# Patient Record
Sex: Male | Born: 1960 | Race: White | Hispanic: No | Marital: Single | Smoking: Never smoker
Health system: Southern US, Community
[De-identification: ages and names within clinical notes are randomized; demographics above are authoritative.]

## PROBLEM LIST (undated history)

## (undated) DIAGNOSIS — F0390 Unspecified dementia without behavioral disturbance: Secondary | ICD-10-CM

## (undated) DIAGNOSIS — I10 Essential (primary) hypertension: Secondary | ICD-10-CM

## (undated) DIAGNOSIS — N179 Acute kidney failure, unspecified: Secondary | ICD-10-CM

## (undated) DIAGNOSIS — E079 Disorder of thyroid, unspecified: Secondary | ICD-10-CM

## (undated) HISTORY — PX: NO PAST SURGERIES: SHX2092

---

## 1998-04-27 ENCOUNTER — Emergency Department (HOSPITAL_COMMUNITY): Admission: EM | Admit: 1998-04-27 | Discharge: 1998-04-27 | Payer: Self-pay | Admitting: Emergency Medicine

## 1998-06-24 ENCOUNTER — Inpatient Hospital Stay (HOSPITAL_COMMUNITY): Admission: EM | Admit: 1998-06-24 | Discharge: 1998-06-28 | Payer: Self-pay | Admitting: Emergency Medicine

## 1998-07-01 ENCOUNTER — Encounter: Admission: RE | Admit: 1998-07-01 | Discharge: 1998-07-01 | Payer: Self-pay | Admitting: Family Medicine

## 1998-11-29 ENCOUNTER — Encounter: Payer: Self-pay | Admitting: Emergency Medicine

## 1998-11-29 ENCOUNTER — Emergency Department (HOSPITAL_COMMUNITY): Admission: EM | Admit: 1998-11-29 | Discharge: 1998-11-29 | Payer: Self-pay | Admitting: Emergency Medicine

## 1999-01-01 ENCOUNTER — Emergency Department (HOSPITAL_COMMUNITY): Admission: EM | Admit: 1999-01-01 | Discharge: 1999-01-01 | Payer: Self-pay | Admitting: Emergency Medicine

## 1999-01-02 ENCOUNTER — Encounter: Payer: Self-pay | Admitting: Emergency Medicine

## 1999-01-02 ENCOUNTER — Inpatient Hospital Stay (HOSPITAL_COMMUNITY): Admission: EM | Admit: 1999-01-02 | Discharge: 1999-01-06 | Payer: Self-pay | Admitting: Emergency Medicine

## 1999-01-14 ENCOUNTER — Emergency Department (HOSPITAL_COMMUNITY): Admission: EM | Admit: 1999-01-14 | Discharge: 1999-01-14 | Payer: Self-pay | Admitting: Emergency Medicine

## 1999-01-22 ENCOUNTER — Encounter: Payer: Self-pay | Admitting: Emergency Medicine

## 1999-01-22 ENCOUNTER — Emergency Department (HOSPITAL_COMMUNITY): Admission: EM | Admit: 1999-01-22 | Discharge: 1999-01-22 | Payer: Self-pay | Admitting: Emergency Medicine

## 1999-02-20 ENCOUNTER — Emergency Department (HOSPITAL_COMMUNITY): Admission: EM | Admit: 1999-02-20 | Discharge: 1999-02-20 | Payer: Self-pay | Admitting: Emergency Medicine

## 1999-02-20 ENCOUNTER — Encounter: Payer: Self-pay | Admitting: Emergency Medicine

## 2005-02-22 ENCOUNTER — Emergency Department (HOSPITAL_COMMUNITY): Admission: EM | Admit: 2005-02-22 | Discharge: 2005-02-22 | Payer: Self-pay | Admitting: Emergency Medicine

## 2005-03-24 ENCOUNTER — Inpatient Hospital Stay (HOSPITAL_COMMUNITY): Admission: RE | Admit: 2005-03-24 | Discharge: 2005-03-28 | Payer: Self-pay | Admitting: Emergency Medicine

## 2005-03-25 ENCOUNTER — Encounter: Payer: Self-pay | Admitting: Internal Medicine

## 2005-03-29 ENCOUNTER — Emergency Department (HOSPITAL_COMMUNITY): Admission: EM | Admit: 2005-03-29 | Discharge: 2005-03-29 | Payer: Self-pay | Admitting: Emergency Medicine

## 2005-05-01 ENCOUNTER — Emergency Department (HOSPITAL_COMMUNITY): Admission: EM | Admit: 2005-05-01 | Discharge: 2005-05-01 | Payer: Self-pay | Admitting: Emergency Medicine

## 2005-11-21 ENCOUNTER — Emergency Department (HOSPITAL_COMMUNITY): Admission: EM | Admit: 2005-11-21 | Discharge: 2005-11-21 | Payer: Self-pay | Admitting: Emergency Medicine

## 2006-02-21 ENCOUNTER — Encounter: Payer: Self-pay | Admitting: Family Medicine

## 2006-09-24 ENCOUNTER — Emergency Department (HOSPITAL_COMMUNITY): Admission: EM | Admit: 2006-09-24 | Discharge: 2006-09-24 | Payer: Self-pay | Admitting: Emergency Medicine

## 2007-06-11 ENCOUNTER — Emergency Department (HOSPITAL_COMMUNITY): Admission: EM | Admit: 2007-06-11 | Discharge: 2007-06-11 | Payer: Self-pay | Admitting: Emergency Medicine

## 2007-06-20 ENCOUNTER — Emergency Department (HOSPITAL_COMMUNITY): Admission: EM | Admit: 2007-06-20 | Discharge: 2007-06-20 | Payer: Self-pay | Admitting: Emergency Medicine

## 2007-07-02 ENCOUNTER — Emergency Department (HOSPITAL_COMMUNITY): Admission: EM | Admit: 2007-07-02 | Discharge: 2007-07-03 | Payer: Self-pay | Admitting: Emergency Medicine

## 2008-02-21 ENCOUNTER — Emergency Department (HOSPITAL_COMMUNITY): Admission: EM | Admit: 2008-02-21 | Discharge: 2008-02-21 | Payer: Self-pay | Admitting: Emergency Medicine

## 2008-11-06 ENCOUNTER — Emergency Department (HOSPITAL_COMMUNITY): Admission: EM | Admit: 2008-11-06 | Discharge: 2008-11-06 | Payer: Self-pay | Admitting: Emergency Medicine

## 2009-04-03 ENCOUNTER — Emergency Department (HOSPITAL_COMMUNITY): Admission: EM | Admit: 2009-04-03 | Discharge: 2009-04-03 | Payer: Self-pay | Admitting: Emergency Medicine

## 2009-08-27 ENCOUNTER — Emergency Department (HOSPITAL_COMMUNITY): Admission: EM | Admit: 2009-08-27 | Discharge: 2009-08-28 | Payer: Self-pay | Admitting: Emergency Medicine

## 2009-11-10 ENCOUNTER — Ambulatory Visit (HOSPITAL_COMMUNITY): Admission: RE | Admit: 2009-11-10 | Discharge: 2009-11-10 | Payer: Self-pay | Admitting: Urology

## 2010-03-21 ENCOUNTER — Emergency Department (HOSPITAL_COMMUNITY): Admission: EM | Admit: 2010-03-21 | Discharge: 2010-03-21 | Payer: Self-pay | Admitting: Emergency Medicine

## 2010-07-22 ENCOUNTER — Emergency Department (HOSPITAL_COMMUNITY): Admission: EM | Admit: 2010-07-22 | Discharge: 2010-07-22 | Payer: Self-pay | Admitting: Emergency Medicine

## 2010-07-23 ENCOUNTER — Emergency Department (HOSPITAL_COMMUNITY): Admission: EM | Admit: 2010-07-23 | Discharge: 2010-07-23 | Payer: Self-pay | Admitting: Emergency Medicine

## 2010-09-16 ENCOUNTER — Emergency Department (HOSPITAL_COMMUNITY)
Admission: EM | Admit: 2010-09-16 | Discharge: 2010-09-16 | Payer: Self-pay | Source: Home / Self Care | Admitting: Emergency Medicine

## 2011-01-05 LAB — POCT I-STAT, CHEM 8
BUN: 21 mg/dL (ref 6–23)
Creatinine, Ser: 1 mg/dL (ref 0.4–1.5)
Hemoglobin: 14.6 g/dL (ref 13.0–17.0)
Potassium: 4 mEq/L (ref 3.5–5.1)
Sodium: 138 mEq/L (ref 135–145)
TCO2: 28 mmol/L (ref 0–100)

## 2011-01-25 LAB — URINALYSIS, ROUTINE W REFLEX MICROSCOPIC
Nitrite: NEGATIVE
Protein, ur: NEGATIVE mg/dL
Specific Gravity, Urine: 1.023 (ref 1.005–1.030)
Urobilinogen, UA: 1 mg/dL (ref 0.0–1.0)

## 2011-01-25 LAB — URINE MICROSCOPIC-ADD ON

## 2011-01-25 LAB — POCT I-STAT, CHEM 8
BUN: 16 mg/dL (ref 6–23)
Calcium, Ion: 1.12 mmol/L (ref 1.12–1.32)
Creatinine, Ser: 1 mg/dL (ref 0.4–1.5)
Glucose, Bld: 87 mg/dL (ref 70–99)
Hemoglobin: 15 g/dL (ref 13.0–17.0)
TCO2: 25 mmol/L (ref 0–100)

## 2011-01-30 LAB — BASIC METABOLIC PANEL
BUN: 13 mg/dL (ref 6–23)
CO2: 28 mEq/L (ref 19–32)
Calcium: 9.1 mg/dL (ref 8.4–10.5)
Creatinine, Ser: 1.04 mg/dL (ref 0.4–1.5)
GFR calc non Af Amer: >60 mL/min (ref >60)
Glucose, Bld: 102 mg/dL — ABNORMAL HIGH (ref 70–99)
Sodium: 141 mEq/L (ref 135–145)

## 2011-01-30 LAB — CBC
Hemoglobin: 15.7 g/dL (ref 13.0–17.0)
MCHC: 34.4 g/dL (ref 30.0–36.0)
Platelets: 221 10*3/uL (ref 150–400)
RDW: 12.7 % (ref 11.5–15.5)

## 2011-01-30 LAB — DIFFERENTIAL
Basophils Absolute: 0.2 10*3/uL — ABNORMAL HIGH (ref 0.0–0.1)
Basophils Relative: 1 % (ref 0–1)
Lymphocytes Relative: 6 % — ABNORMAL LOW (ref 12–46)
Monocytes Absolute: 0.4 10*3/uL (ref 0.1–1.0)
Neutro Abs: 12.3 10*3/uL — ABNORMAL HIGH (ref 1.7–7.7)

## 2011-03-10 NOTE — H&P (Signed)
Donald Montoya, POSTIGLIONE NO.:  [id number]   MEDICAL RECORD NO.:  [id number]          PATIENT TYPE:  INP   LOCATION:  0104                         FACILITY:  [facility]   PHYSICIAN:  Thora Lance, M.D.  DATE OF BIRTH:  1960-12-08   DATE OF ADMISSION:  03/24/2005  DATE OF DISCHARGE:                                HISTORY & PHYSICAL   CHIEF COMPLAINT:  Vomiting.   HISTORY OF PRESENT ILLNESS:  This is a 50 year old white male resident of  [facility] with a history of hypertension and multi-  infarct dementia who presents with one day of multiple episodes of vomiting.  The patient is a very poor historian, and most of the history is obtained  from the nurse at the facility.  The patient may have had several black-out  spells.  He was not able to steer his wheelchair through the hall.  The RN  at the facility denied any hematemesis, melena, or bright red blood per  rectum.  He is on no new medications.  In the ER, he is found to have  significant azotemia and hypernatremia.   PAST MEDICAL HISTORY:  1.  Multi-infarct dementia.  2.  Hypertension.   PAST SURGICAL HISTORY:  Unknown.   ALLERGIES:  No known drug allergies.   CURRENT MEDICATIONS:  1.  Seroquel 50 mg in the a.m. and 100 mg in the p.m.  2.  Metoprolol 25 mg daily.  3.  Zocor 20 mg daily.  4.  Amantidine 200 mg b.i.d.  5.  Aggrenox 25/200 1 capsule twice daily.  6.  Depakote 250 mg 3 tablets q.h.s.  7.  Promethazine 25 mg q.4h. p.r.n.  8.  Triamcinolone 0.5% cream to legs p.r.n.   FAMILY HISTORY:  Unknown   SOCIAL HISTORY:  Unknown.   PHYSICAL EXAMINATION:  VITAL SIGNS:  Temperature 98.8, blood pressure  132/72, heart rate 90, respirations 20.  GENERAL:  He is alert, nonverbal, but does follow commands.  HEENT:  Anicteric.  Pupils are equal and reactive to light.  Extraocular  movements are intact.  Oropharynx shows very dry mucous membranes with some  dried blood in his mouth.  NECK:  Supple.  No lymphadenopathy.  LUNGS:  Clear.  HEART:  Regular rate and rhythm without murmur, rub or gallop.  ABDOMEN:  Soft and nontender.  No mass or hepatosplenomegaly.  RECTAL:  Declined by patient.  EXTREMITIES:  No edema.   LABORATORY:  CBC:  WBC 14.9 with a leftward shift.  Hemoglobin 11.2,  platelet count 253.  Chemistries:  Sodium 167, potassium 4.2, chloride 126,  bicarbonate 32, glucose 131, BUN 85, creatinine 3.6, calcium 8.9.  Total  protein 6.2, albumin 3.5, SGOT 24, SGPT 18, alk phos 90, total bilirubin  0.8.  Urinalysis shows 0-2 WBCs, 3-6 RBCs.   CT scan of the brain showed multiple old bilateral basal ganglia and  __________ strokes.  Old stroke involving the white matter in the right  frontal lobe and severe generalized atrophy for age.   Chest x-ray pending.   ASSESSMENT:  1.  Vomiting.  2.  Volume depletion/azotemia.  3.  Dehydration and hypernatremia.  4.  Hypertension.  5.  Multi-infarct dementia.   PLAN:  Admit.  IV normal saline x2 liters, then switch to D5-1/2 normal  saline.  Check chest x-ray, Depakote level, and CPK level.  Follow labs.  Hold Zocor and Aggrenox for now.      JJG/MEDQ  D:  03/24/2005  T:  03/24/2005  Job:  [id number]

## 2011-03-10 NOTE — Discharge Summary (Signed)
NAMEODIES, DESA NO.:  [id number]   MEDICAL RECORD NO.:  [id number]          PATIENT TYPE:  INP   LOCATION:  4708                         FACILITY:  [facility]   PHYSICIAN:  Hollice Espy, M.D.DATE OF BIRTH:  1961/02/17   DATE OF ADMISSION:  03/25/2005  DATE OF DISCHARGE:  03/28/2005                                 DISCHARGE SUMMARY   PRIMARY CARE PHYSICIAN:  The patient is followed at the [facility] where he resides.   DISCHARGE DIAGNOSES:  1.  Nausea and vomiting, suspected viral gastroenteritis.  2.  Volume depletion and acute renal failure from dehydration.  3.  Hypernatremia felt to be secondary to #1 and #2.  4.  Hypertension.  5.  History of multi-infarct dementia.  6.  Subtherapeutic Depakote level.   DISCHARGE MEDICATIONS:  The patient will be continued on all of his previous  medications as prior to coming in.  1.  Seroquel 50 p.o. daily.  2.  Metoprolol 25 p.o. daily.  3.  Zocor 20 p.o. daily.  4.  Amantadine 200 p.o. b.i.d.  5.  Aggrenox 25/200 one p.o. b.i.d.  6.  Phenergan 25 p.r.n.  7.  Seroquel 100 mg p.o. q.h.s.  8.  Depakote 750 p.o. q.h.s.  9.  Xanax 1 mg p.o. b.i.d.  10. Macrobid 100 mg p.o. b.i.d. which is to be continued for approximately      10 more days.  11. Antivert 12.5 mg p.o. p.r.n.   HOSPITAL COURSE:  The patient is a 50 year old white male with past medical  history of multi-infarct dementia, who lives and resides in assisted living  home, who was brought into the facility with altered mental status,  increased confusion, nausea, vomiting, inability to take p.o.  The patient  initially was found to have an elevated white count of 14.9 and BUN and  creatinine of 85 and 3.6, respectively.  Also of concern was a sodium level  of 167.  The patient was immediately started on IV fluids, Phenergan for  nausea.  He showed no evidence of any shift on his white count and, by the  second day, was already  showing signs of improvement with decreasing BUN and  creatinine at 75 and 2.5, respectively, and sodium coming down to 160.  He  continue to receive IV fluids and nourishment and became much more alert and  oriented.   By March 27, 2005, his sodium had come down to 148.  BUN and creatinine had  come down to near normal at 21 and 1.3, respectively.  His white count had  also normalized at 7.5.  The rest of his lab work was unremarkable.  Initially hemoglobin and hematocrit dropped from 11.2 and 32.8 down to 9.9  and 29.2, respectively, but it was felt the initial level was likely more  concentrated.  In addition in regards to his Subtherapeutic Depakote level,  the patient is on 750 mg p.o. q.h.s. with level checked on day of admission  found to be Subtherapeutic at 10.5.  Likely this factor was secondary to  patient's nausea and vomiting and inability  to take p.o.  After discussion  with pharmacy, the patient is being loaded on day prior to discharge with IV  Depakote 20 mg/kg for a loading dose.  He will then continue on his nightly  dose.   The patient has shown no further signs of nausea and vomiting. We will put  him on a low-sodium diet.  If he tolerates this well, then the plan will be  to transfer him on June 6 back to [facility].      SKK/MEDQ  D:  03/27/2005  T:  03/27/2005  Job:  [id number]

## 2011-08-04 LAB — BASIC METABOLIC PANEL
BUN: 21
Calcium: 8.9
GFR calc non Af Amer: >60
Glucose, Bld: 83
Potassium: 4.3
Sodium: 141

## 2011-08-04 LAB — COMPREHENSIVE METABOLIC PANEL
AST: 25
Albumin: 3.9
Calcium: 9.5
Creatinine, Ser: 1.24
GFR calc Af Amer: >60
GFR calc non Af Amer: >60
Total Protein: 7.5

## 2011-08-04 LAB — I-STAT 8, (EC8 V) (CONVERTED LAB)
Acid-Base Excess: 1
Chloride: 105
Glucose, Bld: 90
Hemoglobin: 16
Potassium: 4.1
Sodium: 139
TCO2: 28

## 2011-08-04 LAB — URINALYSIS, ROUTINE W REFLEX MICROSCOPIC
Glucose, UA: NEGATIVE
Nitrite: NEGATIVE
Protein, ur: NEGATIVE
pH: 5.5

## 2011-08-04 LAB — CBC
HCT: 44
Hemoglobin: 14.9
MCHC: 34.7
MCV: 86.3
Platelets: 242
Platelets: 238
Platelets: 244
RDW: 14.3 — ABNORMAL HIGH
RDW: 13.5
RDW: 13.8
WBC: 9.2
WBC: 7.7

## 2011-08-04 LAB — DIFFERENTIAL
Basophils Absolute: 0.1
Eosinophils Relative: 4
Lymphocytes Relative: 17
Lymphocytes Relative: 8 — ABNORMAL LOW
Lymphs Abs: 0.9
Lymphs Abs: 1.3
Monocytes Relative: 6
Neutro Abs: 10.4 — ABNORMAL HIGH
Neutro Abs: 5.3
Neutrophils Relative %: 86 — ABNORMAL HIGH

## 2011-08-04 LAB — VALPROIC ACID LEVEL
Valproic Acid Lvl: 25.2 — ABNORMAL LOW
Valproic Acid Lvl: 28.2 — ABNORMAL LOW

## 2011-08-04 LAB — POCT I-STAT CREATININE: Operator id: 272551

## 2011-08-04 LAB — LIPASE, BLOOD: Lipase: 34

## 2015-10-13 ENCOUNTER — Encounter (HOSPITAL_COMMUNITY): Payer: Self-pay | Admitting: Emergency Medicine

## 2015-10-13 ENCOUNTER — Observation Stay (HOSPITAL_COMMUNITY)
Admission: EM | Admit: 2015-10-13 | Discharge: 2015-10-15 | Payer: Medicare Other | Attending: Internal Medicine | Admitting: Internal Medicine

## 2015-10-13 ENCOUNTER — Emergency Department (HOSPITAL_COMMUNITY): Payer: Medicare Other

## 2015-10-13 DIAGNOSIS — R9431 Abnormal electrocardiogram [ECG] [EKG]: Secondary | ICD-10-CM | POA: Insufficient documentation

## 2015-10-13 DIAGNOSIS — F0390 Unspecified dementia without behavioral disturbance: Secondary | ICD-10-CM

## 2015-10-13 DIAGNOSIS — Z7982 Long term (current) use of aspirin: Secondary | ICD-10-CM | POA: Insufficient documentation

## 2015-10-13 DIAGNOSIS — E876 Hypokalemia: Secondary | ICD-10-CM | POA: Diagnosis not present

## 2015-10-13 DIAGNOSIS — I10 Essential (primary) hypertension: Secondary | ICD-10-CM | POA: Diagnosis not present

## 2015-10-13 DIAGNOSIS — M542 Cervicalgia: Secondary | ICD-10-CM | POA: Diagnosis not present

## 2015-10-13 DIAGNOSIS — Z79899 Other long term (current) drug therapy: Secondary | ICD-10-CM | POA: Diagnosis not present

## 2015-10-13 DIAGNOSIS — W19XXXA Unspecified fall, initial encounter: Secondary | ICD-10-CM

## 2015-10-13 DIAGNOSIS — E039 Hypothyroidism, unspecified: Secondary | ICD-10-CM | POA: Diagnosis not present

## 2015-10-13 DIAGNOSIS — R55 Syncope and collapse: Principal | ICD-10-CM | POA: Insufficient documentation

## 2015-10-13 DIAGNOSIS — R001 Bradycardia, unspecified: Secondary | ICD-10-CM | POA: Diagnosis not present

## 2015-10-13 DIAGNOSIS — Z8673 Personal history of transient ischemic attack (TIA), and cerebral infarction without residual deficits: Secondary | ICD-10-CM | POA: Insufficient documentation

## 2015-10-13 HISTORY — DX: Unspecified dementia, unspecified severity, without behavioral disturbance, psychotic disturbance, mood disturbance, and anxiety: F03.90

## 2015-10-13 HISTORY — DX: Essential (primary) hypertension: I10

## 2015-10-13 LAB — TSH: TSH: 0.867 u[IU]/mL (ref 0.350–4.500)

## 2015-10-13 LAB — CBC WITH DIFFERENTIAL/PLATELET
BASOS PCT: 0 %
Basophils Absolute: 0 10*3/uL (ref 0.0–0.1)
EOS PCT: 5 %
Eosinophils Absolute: 0.3 10*3/uL (ref 0.0–0.7)
HEMATOCRIT: 41.6 % (ref 39.0–52.0)
Hemoglobin: 13.8 g/dL (ref 13.0–17.0)
Lymphocytes Relative: 17 %
Lymphs Abs: 1.1 10*3/uL (ref 0.7–4.0)
MCH: 30.1 pg (ref 26.0–34.0)
MCHC: 33.2 g/dL (ref 30.0–36.0)
MCV: 90.6 fL (ref 78.0–100.0)
MONO ABS: 0.4 10*3/uL (ref 0.1–1.0)
MONOS PCT: 6 %
NEUTROS ABS: 4.5 10*3/uL (ref 1.7–7.7)
Neutrophils Relative %: 72 %
PLATELETS: 137 10*3/uL — AB (ref 150–400)
RBC: 4.59 MIL/uL (ref 4.22–5.81)
RDW: 13.8 % (ref 11.5–15.5)
WBC: 6.3 10*3/uL (ref 4.0–10.5)

## 2015-10-13 LAB — BASIC METABOLIC PANEL
ANION GAP: 7 (ref 5–15)
BUN: 34 mg/dL — ABNORMAL HIGH (ref 6–20)
CO2: 32 mmol/L (ref 22–32)
Calcium: 9.3 mg/dL (ref 8.9–10.3)
Chloride: 102 mmol/L (ref 101–111)
Creatinine, Ser: 0.93 mg/dL (ref 0.61–1.24)
GFR calc Af Amer: >60 mL/min (ref >60)
GFR calc non Af Amer: >60 mL/min (ref >60)
GLUCOSE: 82 mg/dL (ref 65–99)
POTASSIUM: 5.3 mmol/L — AB (ref 3.5–5.1)
Sodium: 141 mmol/L (ref 135–145)

## 2015-10-13 LAB — I-STAT TROPONIN, ED: TROPONIN I, POC: 0.01 ng/mL (ref 0.00–0.08)

## 2015-10-13 LAB — MAGNESIUM: Magnesium: 2.2 mg/dL (ref 1.7–2.4)

## 2015-10-13 LAB — TROPONIN I: Troponin I: <0.03 ng/mL (ref <0.031)

## 2015-10-13 LAB — MRSA PCR SCREENING: MRSA by PCR: NEGATIVE

## 2015-10-13 MED ORDER — SERTRALINE HCL 50 MG PO TABS
75.0000 mg | ORAL_TABLET | Freq: Every day | ORAL | Status: DC
  Administered 2015-10-14 – 2015-10-15 (×2): 75 mg via ORAL
  Filled 2015-10-13 (×2): qty 1

## 2015-10-13 MED ORDER — ATORVASTATIN CALCIUM 10 MG PO TABS
10.0000 mg | ORAL_TABLET | Freq: Every day | ORAL | Status: DC
  Administered 2015-10-13 – 2015-10-14 (×2): 10 mg via ORAL
  Filled 2015-10-13 (×3): qty 1

## 2015-10-13 MED ORDER — HEPARIN SODIUM (PORCINE) 5000 UNIT/ML IJ SOLN
5000.0000 [IU] | Freq: Three times a day (TID) | INTRAMUSCULAR | Status: DC
  Administered 2015-10-13 – 2015-10-15 (×6): 5000 [IU] via SUBCUTANEOUS
  Filled 2015-10-13 (×5): qty 1

## 2015-10-13 MED ORDER — ONDANSETRON HCL 4 MG/2ML IJ SOLN
4.0000 mg | Freq: Four times a day (QID) | INTRAMUSCULAR | Status: DC | PRN
Start: 2015-10-13 — End: 2015-10-15

## 2015-10-13 MED ORDER — ACETAMINOPHEN 650 MG RE SUPP
650.0000 mg | Freq: Four times a day (QID) | RECTAL | Status: DC | PRN
Start: 2015-10-13 — End: 2015-10-15

## 2015-10-13 MED ORDER — QUETIAPINE FUMARATE 25 MG PO TABS
50.0000 mg | ORAL_TABLET | Freq: Every day | ORAL | Status: DC
  Administered 2015-10-13 – 2015-10-14 (×2): 50 mg via ORAL
  Filled 2015-10-13 (×2): qty 2

## 2015-10-13 MED ORDER — VITAMIN D 1000 UNITS PO TABS
2000.0000 [IU] | ORAL_TABLET | Freq: Every day | ORAL | Status: DC
  Administered 2015-10-14 – 2015-10-15 (×2): 2000 [IU] via ORAL
  Filled 2015-10-13 (×2): qty 2

## 2015-10-13 MED ORDER — ONDANSETRON HCL 4 MG PO TABS: 4.0000 mg | ORAL_TABLET | Freq: Four times a day (QID) | ORAL | Status: DC | PRN

## 2015-10-13 MED ORDER — ACETAMINOPHEN 325 MG PO TABS: 650.0000 mg | ORAL_TABLET | Freq: Four times a day (QID) | ORAL | Status: DC | PRN

## 2015-10-13 MED ORDER — LEVOTHYROXINE SODIUM 25 MCG PO TABS
25.0000 ug | ORAL_TABLET | Freq: Every day | ORAL | Status: DC
  Administered 2015-10-14 – 2015-10-15 (×2): 25 ug via ORAL
  Filled 2015-10-13 (×3): qty 1

## 2015-10-13 MED ORDER — ASPIRIN EC 81 MG PO TBEC
81.0000 mg | DELAYED_RELEASE_TABLET | Freq: Every day | ORAL | Status: DC
  Administered 2015-10-14 – 2015-10-15 (×2): 81 mg via ORAL
  Filled 2015-10-13 (×2): qty 1

## 2015-10-13 MED ORDER — FUROSEMIDE 10 MG/ML IJ SOLN
20.0000 mg | Freq: Once | INTRAMUSCULAR | Status: AC
  Administered 2015-10-13: 20 mg via INTRAVENOUS
  Filled 2015-10-13: qty 4

## 2015-10-13 MED ORDER — SODIUM CHLORIDE 0.9 % IJ SOLN
3.0000 mL | Freq: Two times a day (BID) | INTRAMUSCULAR | Status: DC
  Administered 2015-10-13 – 2015-10-15 (×4): 3 mL via INTRAVENOUS

## 2015-10-13 MED ORDER — DIVALPROEX SODIUM ER 500 MG PO TB24
750.0000 mg | ORAL_TABLET | Freq: Every day | ORAL | Status: DC
  Administered 2015-10-13 – 2015-10-14 (×2): 750 mg via ORAL
  Filled 2015-10-13 (×2): qty 1

## 2015-10-13 MED ORDER — LISINOPRIL 2.5 MG PO TABS
2.5000 mg | ORAL_TABLET | Freq: Every day | ORAL | Status: DC
Start: 2015-10-14 — End: 2015-10-15
  Administered 2015-10-14 – 2015-10-15 (×2): 2.5 mg via ORAL
  Filled 2015-10-13 (×2): qty 1

## 2015-10-13 MED ORDER — DONEPEZIL HCL 10 MG PO TABS
10.0000 mg | ORAL_TABLET | Freq: Every day | ORAL | Status: DC
  Filled 2015-10-13: qty 1

## 2015-10-13 NOTE — ED Notes (Signed)
Spoke with Ron NS on 4E. 20 min start at 1605. Taquita RN aware

## 2015-10-13 NOTE — ED Notes (Signed)
Patient bradycardic HR 35-45. MD notified

## 2015-10-13 NOTE — Progress Notes (Signed)
Medicare pt confirms no pcp and "[place]"   Agreed to allow CM to obtain an appt for him with a medicare providers Cm spoke with Morrie SheldonAshley at [facility] [place] [facility]  To obtain an appointment for 11/19/15 at 1100 Entered in D/C instructions  [place], Edwin Go on 11/19/2015 You have been scheduled an appointment to see Dr Dorma RussellEdwin on November 19 2015 at 11 am please bring your ID and insurance cards [address] [place] [id number] [phone]

## 2015-10-13 NOTE — ED Notes (Signed)
[facility]. Hit head, no LOC, no obvious injuries. Dementia. 94/78 60 14. Ambulatory.

## 2015-10-13 NOTE — ED Notes (Signed)
Bed: [id number] Expected date:  Expected time:  Means of arrival:  Comments: 54 y/o M fall

## 2015-10-13 NOTE — Consult Note (Signed)
Cardiology Consult    Patient ID: Donald Montoya MRN: [id number], DOB/AGE: Mar 10, 1961   Admit date: 10/13/2015 Date of Consult: 10/13/2015  Primary Physician: Dorrene German, MD Primary Cardiologist: None Requesting Provider: [facility]  Patient Profile    54 year old gentleman brought in from [place]  after a fall.  Noted to have bradycardia.  Past Medical History   Past Medical History  Diagnosis Date  . Dementia   . Hypertension     History reviewed. No pertinent past surgical history.   Allergies  No Known Allergies  History of Present Illness    54 year old gentleman brought in from [place] after he had a fall today the patient has a history of dementia.  He is not a good historian.  He states that he does not have any prior history of falls or any knowledge of prior heart problems.  He was noted to have sinus bradycardia into the 40s while in the emergency room.  Therefore emergency room physicians consult cardiology.  The patient denies any chest pain or shortness of breath.  He denies any feelings of dizziness prior to the fall.  He states he was walking with his walker when he fell.  He does not know what caused the fall.  He does not think he was unconscious for any length of time.  Of note is that the patient has been on donepezil 10 mg at bedtime.  Inpatient Medications    . aspirin EC  81 mg Oral Daily  . atorvastatin  10 mg Oral QHS  . divalproex  750 mg Oral QHS  . donepezil  10 mg Oral QHS  . heparin  5,000 Units Subcutaneous 3 times per day  . [START ON 10/14/2015] levothyroxine  25 mcg Oral QAC breakfast  . lisinopril  2.5 mg Oral Daily  . QUEtiapine  50 mg Oral QHS  . sertraline  75 mg Oral Daily  . sodium chloride  3 mL Intravenous Q12H  . Vitamin D  2,000 Units Oral Daily    Family History    History reviewed. No pertinent family history.  Social History    Social History   Social History  . Marital Status:  Single    Spouse Name: N/A  . Number of Children: N/A  . Years of Education: N/A   Occupational History  . Not on file.   Social History Main Topics  . Smoking status: Never Smoker   . Smokeless tobacco: Never Used  . Alcohol Use: No  . Drug Use: Not on file  . Sexual Activity: No   Other Topics Concern  . Not on file   Social History Narrative  . No narrative on file     Review of Systems   Review of systems is unreliable because of his mental status  All other systems reviewed and are otherwise negative except as noted above.  Physical Exam    Blood pressure 116/65, pulse 45, temperature 98.3 F (36.8 C), temperature source Oral, resp. rate 14, SpO2 99 %.  General: Pleasant, NAD Psych: Normal affect. Neuro: Alert and oriented X 3. Moves all extremities spontaneously. HEENT: Normal  Neck: Supple without bruits or JVD. Lungs:  Resp regular and unlabored, CTA. Heart: RRR no s3, s4, or murmurs. Abdomen: Soft, non-tender, non-distended, BS + x 4.  Extremities: No clubbing, cyanosis or edema. DP/PT/Radials 2+ and equal bilaterally.  Labs    Troponin [facility] Test)  Recent Labs  10/13/15 1332  TROPIPOC  0.01    Recent Labs  10/13/15 1324  TROPONINI <0.03   Lab Results  Component Value Date   WBC 6.3 10/13/2015   HGB 13.8 10/13/2015   HCT 41.6 10/13/2015   MCV 90.6 10/13/2015   PLT 137* 10/13/2015     Recent Labs Lab 10/13/15 1324  NA 141  K 5.3*  CL 102  CO2 32  BUN 34*  CREATININE 0.93  CALCIUM 9.3  GLUCOSE 82   No results found for: CHOL, HDL, LDLCALC, TRIG No results found for: [facility]   Radiology Studies    Ct Head Wo Contrast  10/13/2015  CLINICAL DATA:  Pain following fall EXAM: CT HEAD WITHOUT CONTRAST CT CERVICAL SPINE WITHOUT CONTRAST TECHNIQUE: Multidetector CT imaging of the head and cervical spine was performed following the standard protocol without intravenous contrast. Multiplanar CT image reconstructions of the  cervical spine were also generated. COMPARISON:  Head CT July 02, 2007 FINDINGS: CT HEAD FINDINGS Moderate diffuse atrophy is stable. There is no intracranial mass, hemorrhage, extra-axial fluid collection, or midline shift. There is evidence of a prior infarct in the posterior inferior cerebellar region on the left. There is a prior infarct in the anterior left thalamus, stable. There is an infarct involving much of the anterior limb of the left internal capsule and a portion of the anterior limb of the left external capsule, stable. There is a prior infarct in the right anterior lentiform nucleus. There is a prior small infarct in the mid right cerebellum at the posterior most aspect of the right dentate nucleus. No new gray-white compartment lesions are identified. No acute infarct evident. There is mild generalized periventricular small vessel disease. Bony calvarium appears intact. Mastoid air cells are clear. No intraorbital lesions are identified. CT CERVICAL SPINE FINDINGS There is no cervical spine fracture or spondylolisthesis. There is slight superior endplate wedging at T2, likely chronic. Prevertebral soft tissues and predental space regions are normal. There is calcification in the anterior longitudinal ligament at C6-7. There is mild disc space narrowing at C7-T1. There is facet hypertrophy at multiple levels bilaterally. No disc extrusion or stenosis. There are vertebral artery calcifications bilaterally. There is also calcification in each carotid artery. IMPRESSION: CT head: Atrophy. 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 No acute infarct evident. No intracranial mass, hemorrhage, or extra-axial fluid collection. CT cervical spine: Mild anterior endplate concavity at T2, likely chronic. No cervical spine fracture or spondylolisthesis. 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 No disc extrusion or stenosis. Areas of atherosclerotic  vascular calcification. Electronically Signed   By: Bretta Bang III M.D.   On: 10/13/2015 13:02   Ct Cervical Spine Wo Contrast  10/13/2015  CLINICAL DATA:  Pain following fall EXAM: CT HEAD WITHOUT CONTRAST CT CERVICAL SPINE WITHOUT CONTRAST TECHNIQUE: Multidetector CT imaging of the head and cervical spine was performed following the standard protocol without intravenous contrast. Multiplanar CT image reconstructions of the cervical spine were also generated. COMPARISON:  Head CT July 02, 2007 FINDINGS: CT HEAD FINDINGS Moderate diffuse atrophy is stable. There is no intracranial mass, hemorrhage, extra-axial fluid collection, or midline shift. There is evidence of a prior infarct in the posterior inferior cerebellar region on the left. There is a prior infarct in the anterior left thalamus, stable. There is an infarct involving much of the anterior limb of the left internal capsule and a portion of the anterior limb of the left external capsule, stable. There is a prior infarct in  the right anterior lentiform nucleus. There is a prior small infarct in the mid right cerebellum at the posterior most aspect of the right dentate nucleus. No new gray-white compartment lesions are identified. No acute infarct evident. There is mild generalized periventricular small vessel disease. Bony calvarium appears intact. Mastoid air cells are clear. No intraorbital lesions are identified. CT CERVICAL SPINE FINDINGS There is no cervical spine fracture or spondylolisthesis. There is slight superior endplate wedging at T2, likely chronic. Prevertebral soft tissues and predental space regions are normal. There is calcification in the anterior longitudinal ligament at C6-7. There is mild disc space narrowing at C7-T1. There is facet hypertrophy at multiple levels bilaterally. No disc extrusion or stenosis. There are vertebral artery calcifications bilaterally. There is also calcification in each carotid artery.  IMPRESSION: CT head: Atrophy. 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 No acute infarct evident. No intracranial mass, hemorrhage, or extra-axial fluid collection. CT cervical spine: Mild anterior endplate concavity at T2, likely chronic. No cervical spine fracture or spondylolisthesis. 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 No disc extrusion or stenosis. Areas of atherosclerotic vascular calcification. Electronically Signed   By: Bretta BangWilliam  Woodruff III M.D.   On: 10/13/2015 13:02    ECG & Cardiac Imaging    Marked sinus bradycardia at 40 bpm.  Otherwise normal.  PR interval normal.  Assessment & Plan    1.  Possible syncope versus mechanical fall.  It is difficult to know whether this was syncope or a mechanical fall from his history. 2.  Marked sinus bradycardia.  This may be exacerbated by donepezil which we will stop. 3.  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  There is also marked atrophy.  We will check an echo to rule out embolic source. 4.  Dementia  Signed, Ronny FlurryBrackbill, Tom, MD  10/13/2015, 3:54 PM

## 2015-10-13 NOTE — ED Provider Notes (Signed)
The patient is a 54 year old male, he has a history of dementia, evidently the patient was leaning forward to pick something up when he fell forward striking his head on the ground. The patient does not have any complaints, he is pleasantly demented, he is severely bradycardic to the upper 30s but has a normal blood pressure, strong pulses, does not appear to be in any distress. His lungs are clear, there is no obvious murmurs, no edema, no deformities of the extremities. Level V caveat apply secondary to the patient's inability to communicate.  Anticipate admission for new onset bradycardia with fall  Medical screening examination/treatment/procedure(s) were conducted as a shared visit with non-physician practitioner(s) and myself.  I personally evaluated the patient during the encounter.  Clinical Impression:   Final diagnoses:  Fall  Bradycardia  Pre-syncope         Eber HongBrian Shakita Keir, MD 10/13/15 2105

## 2015-10-13 NOTE — ED Provider Notes (Signed)
CSN: [id number]     Arrival date & time 10/13/15  1153 History   First MD Initiated Contact with Patient 10/13/15 1201     Chief Complaint  Patient presents with  . Fall     (Consider location/radiation/quality/duration/timing/severity/associated sxs/prior Treatment) HPI   Blood pressure 114/62, pulse 43, temperature 98.3 F (36.8 C), temperature source Oral, resp. rate 15, SpO2 99 %.  Donald Montoya is a 54 y.o. male brought in by EMS from SNF for evaluation of fall earlier in the day, hit head there was no loss of consciousness, cannot explain how he fell. Has history of dementia. Level V caveat. Vitals per EMS were 94/78 pulse of 60 pulse of 14. [facility], states that he was bending down to pick something up and he fell. States that he is normally ambulatory and independent without issue. States that he is not typically bradycardic. Paperwork from SNF shows heart rate has always been in the upper 60s to low 70s.  Past Medical History  Diagnosis Date  . Dementia   . Hypertension    History reviewed. No pertinent past surgical history. History reviewed. No pertinent family history. Social History  Substance Use Topics  . Smoking status: Never Smoker   . Smokeless tobacco: None  . Alcohol Use: No    Review of Systems  10 systems reviewed and found to be negative, except as noted in the HPI.  Allergies  Review of patient's allergies indicates no known allergies.  Home Medications   Prior to Admission medications   Medication Sig Start Date End Date Taking? Authorizing Provider  acetaminophen (TYLENOL) 500 MG tablet Take 500-1,000 mg by mouth every 4 (four) hours as needed for moderate pain (do not exceed 2000mg  in 24 hours). Take 500mg  every 4 hours as needed for fever; take 1000mg  every 6 hours as needed for pain. Do not exceed 2000mg  in 24 hours   Yes Historical Provider, MD  alum & mag hydroxide-simeth (GERI-LANTA) 200-200-20  MG/5ML suspension Take 30 mLs by mouth every 6 (six) hours as needed for indigestion or heartburn (no more than 4 doses in 24 hours).   Yes Historical Provider, MD  aspirin EC 81 MG tablet Take 81 mg by mouth daily.   Yes Historical Provider, MD  atorvastatin (LIPITOR) 10 MG tablet Take 10 mg by mouth at bedtime.   Yes Historical Provider, MD  Cholecalciferol (VITAMIN D) 2000 UNITS CAPS Take 2,000 Units by mouth daily.   Yes Historical Provider, MD  divalproex (DEPAKOTE ER) 250 MG 24 hr tablet Take 750 mg by mouth at bedtime.   Yes Historical Provider, MD  donepezil (ARICEPT) 10 MG tablet Take 10 mg by mouth at bedtime.   Yes Historical Provider, MD  guaifenesin (ROBITUSSIN) 100 MG/5ML syrup Take 200 mg by mouth every 6 (six) hours as needed for cough (no more than 4 doses in 24 hours).   Yes Historical Provider, MD  levothyroxine (SYNTHROID, LEVOTHROID) 25 MCG tablet Take 25 mcg by mouth daily before breakfast.   Yes Historical Provider, MD  lisinopril (PRINIVIL,ZESTRIL) 2.5 MG tablet Take 2.5 mg by mouth daily.   Yes Historical Provider, MD  loperamide (IMODIUM) 2 MG capsule Take 2 mg by mouth every 4 (four) hours as needed for diarrhea or loose stools (no more than 8 doses in 24 hours).   Yes Historical Provider, MD  magnesium hydroxide (MILK OF MAGNESIA) 400 MG/5ML suspension Take 30 mLs by mouth daily as needed for mild  constipation or moderate constipation.   Yes Historical Provider, MD  neomycin-bacitracin-polymyxin (NEOSPORIN) [phone] ointment Apply 1 application topically 4 (four) times daily as needed (for skin abraisions, minor tears).   Yes Historical Provider, MD  QUEtiapine (SEROQUEL) 50 MG tablet Take 50 mg by mouth at bedtime.   Yes Historical Provider, MD  sertraline (ZOLOFT) 50 MG tablet Take 75 mg by mouth daily.   Yes Historical Provider, MD  triamcinolone cream (KENALOG) 0.1 % Apply 1 application topically 2 (two) times daily as needed (to rash on legs for flareups).   Yes  Historical Provider, MD  white petrolatum (VASELINE) GEL Apply 1 application topically 3 (three) times daily. apply topically to buttocks   Yes Historical Provider, MD  Zinc Oxide (BALMEX EX) Apply 1 application topically 4 (four) times daily as needed (apply to buttocks and groin with each incontinent change).   Yes Historical Provider, MD   BP 116/65 mmHg  Pulse 45  Temp(Src) 98.3 F (36.8 C) (Oral)  Resp 14  SpO2 99% Physical Exam  Constitutional: He appears well-developed and well-nourished. No distress.  Wearing sunglasses and smiling  HENT:  Head: Normocephalic and atraumatic.  Mouth/Throat: Oropharynx is clear and moist.  Eyes: Conjunctivae and EOM are normal. Pupils are equal, round, and reactive to light.  Neck: Normal range of motion. Neck supple.  Cardiovascular: Regular rhythm and intact distal pulses.   Bradycardiac in the upper 30s lower 40s  Pulmonary/Chest: Effort normal and breath sounds normal. No stridor.  Abdominal: Soft. Bowel sounds are normal. He exhibits no distension and no mass. There is no tenderness. There is no rebound and no guarding.  Musculoskeletal: Normal range of motion.  Neurological: He is alert.  MAE, follows commands, pleasantly demented  Psychiatric: He has a normal mood and affect.  Nursing note and vitals reviewed.   ED Course  Procedures (including critical care time) Labs Review Labs Reviewed  CBC WITH DIFFERENTIAL/PLATELET - Abnormal; Notable for the following:    Platelets 137 (*)    All other components within normal limits  BASIC METABOLIC PANEL - Abnormal; Notable for the following:    Potassium 5.3 (*)    BUN 34 (*)    All other components within normal limits  MAGNESIUM  TROPONIN I  TSH  I-STAT TROPOININ, ED    Imaging Review Ct Head Wo Contrast  10/13/2015  CLINICAL DATA:  Pain following fall EXAM: CT HEAD WITHOUT CONTRAST CT CERVICAL SPINE WITHOUT CONTRAST TECHNIQUE: Multidetector CT imaging of the head and cervical  spine was performed following the standard protocol without intravenous contrast. Multiplanar CT image reconstructions of the cervical spine were also generated. COMPARISON:  Head CT July 02, 2007 FINDINGS: CT HEAD FINDINGS Moderate diffuse atrophy is stable. There is no intracranial mass, hemorrhage, extra-axial fluid collection, or midline shift. There is evidence of a prior infarct in the posterior inferior cerebellar region on the left. There is a prior infarct in the anterior left thalamus, stable. There is an infarct involving much of the anterior limb of the left internal capsule and a portion of the anterior limb of the left external capsule, stable. There is a prior infarct in the right anterior lentiform nucleus. There is a prior small infarct in the mid right cerebellum at the posterior most aspect of the right dentate nucleus. No new gray-white compartment lesions are identified. No acute infarct evident. There is mild generalized periventricular small vessel disease. Bony calvarium appears intact. Mastoid air cells are clear. No intraorbital lesions are  identified. CT CERVICAL SPINE FINDINGS There is no cervical spine fracture or spondylolisthesis. There is slight superior endplate wedging at T2, likely chronic. Prevertebral soft tissues and predental space regions are normal. There is calcification in the anterior longitudinal ligament at C6-7. There is mild disc space narrowing at C7-T1. There is facet hypertrophy at multiple levels bilaterally. No disc extrusion or stenosis. There are vertebral artery calcifications bilaterally. There is also calcification in each carotid artery. IMPRESSION: CT head: Atrophy. 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 No acute infarct evident. No intracranial mass, hemorrhage, or extra-axial fluid collection. CT cervical spine: Mild anterior endplate concavity at T2, likely chronic. No cervical spine fracture or spondylolisthesis.  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 No disc extrusion or stenosis. Areas of atherosclerotic vascular calcification. Electronically Signed   By: Bretta Bang III M.D.   On: 10/13/2015 13:02   Ct Cervical Spine Wo Contrast  10/13/2015  CLINICAL DATA:  Pain following fall EXAM: CT HEAD WITHOUT CONTRAST CT CERVICAL SPINE WITHOUT CONTRAST TECHNIQUE: Multidetector CT imaging of the head and cervical spine was performed following the standard protocol without intravenous contrast. Multiplanar CT image reconstructions of the cervical spine were also generated. COMPARISON:  Head CT July 02, 2007 FINDINGS: CT HEAD FINDINGS Moderate diffuse atrophy is stable. There is no intracranial mass, hemorrhage, extra-axial fluid collection, or midline shift. There is evidence of a prior infarct in the posterior inferior cerebellar region on the left. There is a prior infarct in the anterior left thalamus, stable. There is an infarct involving much of the anterior limb of the left internal capsule and a portion of the anterior limb of the left external capsule, stable. There is a prior infarct in the right anterior lentiform nucleus. There is a prior small infarct in the mid right cerebellum at the posterior most aspect of the right dentate nucleus. No new gray-white compartment lesions are identified. No acute infarct evident. There is mild generalized periventricular small vessel disease. Bony calvarium appears intact. Mastoid air cells are clear. No intraorbital lesions are identified. CT CERVICAL SPINE FINDINGS There is no cervical spine fracture or spondylolisthesis. There is slight superior endplate wedging at T2, likely chronic. Prevertebral soft tissues and predental space regions are normal. There is calcification in the anterior longitudinal ligament at C6-7. There is mild disc space narrowing at C7-T1. There is facet hypertrophy at multiple levels bilaterally. No disc  extrusion or stenosis. There are vertebral artery calcifications bilaterally. There is also calcification in each carotid artery. IMPRESSION: CT head: Atrophy. 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 No acute infarct evident. No intracranial mass, hemorrhage, or extra-axial fluid collection. CT cervical spine: Mild anterior endplate concavity at T2, likely chronic. No cervical spine fracture or spondylolisthesis. 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 No disc extrusion or stenosis. Areas of atherosclerotic vascular calcification. Electronically Signed   By: Bretta Bang III M.D.   On: 10/13/2015 13:02   I have personally reviewed and evaluated these images and lab results as part of my medical decision-making.   EKG Interpretation   Date/Time:  Wednesday October 13 2015 12:25:09 EST Ventricular Rate:  40 PR Interval:  153 QRS Duration: 96 QT Interval:  505 QTC Calculation: 412 R Axis:   58 Text Interpretation:  Sinus bradycardia Abnormal ekg Since last tracing  rate slower Confirmed by MILLER  MD, BRIAN ([id number]) on 10/13/2015 1:22:41  PM      MDM  Final diagnoses:  Fall  Bradycardia  Pre-syncope    Filed Vitals:   10/13/15 1158 10/13/15 1426  BP: 114/62 116/65  Pulse: 43 45  Temp: 98.3 F (36.8 C)   TempSrc: Oral   Resp: 15 14  SpO2: 99% 99%    Medications  furosemide (LASIX) injection 20 mg (20 mg Intravenous Given 10/13/15 1511)    Donald Montoya is 54 y.o. male presenting with fall from nursing home, patient significant bradycardic at 48, he is alert, appears to be mentating is baseline (history of dementia), patient does not have a history of bradycardia, I think this may be syncope secondary to his bradycardia. Likely syncope or presyncope. Blood work with mild hypokalemia 5.3, patient given 20 mg of Lasix IV. He has a normal magnesium. Case discussed with [facility] hospitalist Dr. Rhona Leavens who accepts  admission, cardiology also consulted, they will evaluate the patient on the floor.    Wynetta Emery, PA-C 10/13/15 1553  Eber Hong, MD 10/13/15 2105

## 2015-10-13 NOTE — H&P (Signed)
[facility] History and Physical  Donald Montoya [name] DOB: 12-08-1960 DOA: 10/13/2015  Referring physician: Emergency Department PCP: No primary care provider on file.  Specialists:   Chief Complaint: Near Syncope  HPI: Donald Montoya is a 54 y.o. male with a hx of dementia who [facility] and mild bradycardia to the 60's. In the ED, pt was found to have bradycardia to the 40's. EDP consulted Cardiology. Hospitalist was consulted for consideration for admission.  When seen, pt was laying in bed watching TV. Pt is unable to provide detailed history given history of dementia.  Review of Systems:   Review of Systems  Unable to perform ROS: dementia     Past Medical History  Diagnosis Date  . Dementia   . Hypertension    History reviewed. No pertinent past surgical history. Social History:  reports that he has never smoked. He does not have any smokeless tobacco history on file. He reports that he does not drink alcohol. His drug history is not on file.  where does patient live--home, ALF, SNF? and with whom if at home?  Can patient participate in ADLs?  No Known Allergies  History reviewed. No pertinent family history. Unable to obtain - dementia  Prior to Admission medications   Medication Sig Start Date End Date Taking? Authorizing Provider  acetaminophen (TYLENOL) 500 MG tablet Take 500-1,000 mg by mouth every 4 (four) hours as needed for moderate pain (do not exceed 2000mg  in 24 hours). Take 500mg  every 4 hours as needed for fever; take 1000mg  every 6 hours as needed for pain. Do not exceed 2000mg  in 24 hours   Yes Historical Provider, MD  alum & mag hydroxide-simeth (GERI-LANTA) 200-200-20 MG/5ML suspension Take 30 mLs by mouth every 6 (six) hours as needed for indigestion or heartburn (no more than 4 doses in 24 hours).   Yes Historical Provider, MD  aspirin EC 81 MG tablet Take 81 mg by mouth daily.   Yes Historical  Provider, MD  atorvastatin (LIPITOR) 10 MG tablet Take 10 mg by mouth at bedtime.   Yes Historical Provider, MD  Cholecalciferol (VITAMIN D) 2000 UNITS CAPS Take 2,000 Units by mouth daily.   Yes Historical Provider, MD  divalproex (DEPAKOTE ER) 250 MG 24 hr tablet Take 750 mg by mouth at bedtime.   Yes Historical Provider, MD  donepezil (ARICEPT) 10 MG tablet Take 10 mg by mouth at bedtime.   Yes Historical Provider, MD  guaifenesin (ROBITUSSIN) 100 MG/5ML syrup Take 200 mg by mouth every 6 (six) hours as needed for cough (no more than 4 doses in 24 hours).   Yes Historical Provider, MD  levothyroxine (SYNTHROID, LEVOTHROID) 25 MCG tablet Take 25 mcg by mouth daily before breakfast.   Yes Historical Provider, MD  lisinopril (PRINIVIL,ZESTRIL) 2.5 MG tablet Take 2.5 mg by mouth daily.   Yes Historical Provider, MD  loperamide (IMODIUM) 2 MG capsule Take 2 mg by mouth every 4 (four) hours as needed for diarrhea or loose stools (no more than 8 doses in 24 hours).   Yes Historical Provider, MD  magnesium hydroxide (MILK OF MAGNESIA) 400 MG/5ML suspension Take 30 mLs by mouth daily as needed for mild constipation or moderate constipation.   Yes Historical Provider, MD  neomycin-bacitracin-polymyxin (NEOSPORIN) [phone] ointment Apply 1 application topically 4 (four) times daily as needed (for skin abraisions, minor tears).   Yes Historical Provider, MD  QUEtiapine (SEROQUEL) 50 MG tablet Take 50 mg by mouth  at bedtime.   Yes Historical Provider, MD  sertraline (ZOLOFT) 50 MG tablet Take 75 mg by mouth daily.   Yes Historical Provider, MD  triamcinolone cream (KENALOG) 0.1 % Apply 1 application topically 2 (two) times daily as needed (to rash on legs for flareups).   Yes Historical Provider, MD  white petrolatum (VASELINE) GEL Apply 1 application topically 3 (three) times daily. apply topically to buttocks   Yes Historical Provider, MD  Zinc Oxide (BALMEX EX) Apply 1 application topically 4 (four) times  daily as needed (apply to buttocks and groin with each incontinent change).   Yes Historical Provider, MD   Physical Exam: Filed Vitals:   10/13/15 1158 10/13/15 1426  BP: 114/62 116/65  Pulse: 43 45  Temp: 98.3 F (36.8 C)   TempSrc: Oral   Resp: 15 14  SpO2: 99% 99%     General:  Awake, laying in bed, in nad  Eyes: PERRL B  ENT: membranes moist, dentition fair  Neck: trachea midline, neck supple  Cardiovascular: regular, s1, s2  Respiratory: normal resp effort, no wheezing  Abdomen: soft,nondistended  Skin: normal skin turgor no abnormal skin lesions seen  Musculoskeletal: perfused, no clubbing  Psychiatric: mood/affect normal// no auditory/visual hallucinations  Neurologic: cn2-12 grossly intact, strength/sensation intact  Labs on Admission:  Basic Metabolic Panel:  Recent Labs Lab 10/13/15 1324  NA 141  K 5.3*  CL 102  CO2 32  GLUCOSE 82  BUN 34*  CREATININE 0.93  CALCIUM 9.3  MG 2.2   Liver Function Tests: No results for input(s): AST, ALT, ALKPHOS, BILITOT, PROT, ALBUMIN in the last 168 hours. No results for input(s): LIPASE, AMYLASE in the last 168 hours. No results for input(s): AMMONIA in the last 168 hours. CBC:  Recent Labs Lab 10/13/15 1324  WBC 6.3  NEUTROABS 4.5  HGB 13.8  HCT 41.6  MCV 90.6  PLT 137*   Cardiac Enzymes:  Recent Labs Lab 10/13/15 1324  TROPONINI <0.03    BNP (last 3 results) No results for input(s): BNP in the last 8760 hours.  ProBNP (last 3 results) No results for input(s): PROBNP in the last 8760 hours.  CBG: No results for input(s): GLUCAP in the last 168 hours.  Radiological Exams on Admission: Ct Head Wo Contrast  10/13/2015  CLINICAL DATA:  Pain following fall EXAM: CT HEAD WITHOUT CONTRAST CT CERVICAL SPINE WITHOUT CONTRAST TECHNIQUE: Multidetector CT imaging of the head and cervical spine was performed following the standard protocol without intravenous contrast. Multiplanar CT image  reconstructions of the cervical spine were also generated. COMPARISON:  Head CT July 02, 2007 FINDINGS: CT HEAD FINDINGS Moderate diffuse atrophy is stable. There is no intracranial mass, hemorrhage, extra-axial fluid collection, or midline shift. There is evidence of a prior infarct in the posterior inferior cerebellar region on the left. There is a prior infarct in the anterior left thalamus, stable. There is an infarct involving much of the anterior limb of the left internal capsule and a portion of the anterior limb of the left external capsule, stable. There is a prior infarct in the right anterior lentiform nucleus. There is a prior small infarct in the mid right cerebellum at the posterior most aspect of the right dentate nucleus. No new gray-white compartment lesions are identified. No acute infarct evident. There is mild generalized periventricular small vessel disease. Bony calvarium appears intact. Mastoid air cells are clear. No intraorbital lesions are identified. CT CERVICAL SPINE FINDINGS There is no cervical spine fracture  or spondylolisthesis. There is slight superior endplate wedging at T2, likely chronic. Prevertebral soft tissues and predental space regions are normal. There is calcification in the anterior longitudinal ligament at C6-7. There is mild disc space narrowing at C7-T1. There is facet hypertrophy at multiple levels bilaterally. No disc extrusion or stenosis. There are vertebral artery calcifications bilaterally. There is also calcification in each carotid artery. IMPRESSION: CT head: Atrophy. 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 No acute infarct evident. No intracranial mass, hemorrhage, or extra-axial fluid collection. CT cervical spine: Mild anterior endplate concavity at T2, likely chronic. No cervical spine fracture or spondylolisthesis. 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 No disc extrusion or stenosis.  Areas of atherosclerotic vascular calcification. Electronically Signed   By: Bretta Bang III M.D.   On: 10/13/2015 13:02   Ct Cervical Spine Wo Contrast  10/13/2015  CLINICAL DATA:  Pain following fall EXAM: CT HEAD WITHOUT CONTRAST CT CERVICAL SPINE WITHOUT CONTRAST TECHNIQUE: Multidetector CT imaging of the head and cervical spine was performed following the standard protocol without intravenous contrast. Multiplanar CT image reconstructions of the cervical spine were also generated. COMPARISON:  Head CT July 02, 2007 FINDINGS: CT HEAD FINDINGS Moderate diffuse atrophy is stable. There is no intracranial mass, hemorrhage, extra-axial fluid collection, or midline shift. There is evidence of a prior infarct in the posterior inferior cerebellar region on the left. There is a prior infarct in the anterior left thalamus, stable. There is an infarct involving much of the anterior limb of the left internal capsule and a portion of the anterior limb of the left external capsule, stable. There is a prior infarct in the right anterior lentiform nucleus. There is a prior small infarct in the mid right cerebellum at the posterior most aspect of the right dentate nucleus. No new gray-white compartment lesions are identified. No acute infarct evident. There is mild generalized periventricular small vessel disease. Bony calvarium appears intact. Mastoid air cells are clear. No intraorbital lesions are identified. CT CERVICAL SPINE FINDINGS There is no cervical spine fracture or spondylolisthesis. There is slight superior endplate wedging at T2, likely chronic. Prevertebral soft tissues and predental space regions are normal. There is calcification in the anterior longitudinal ligament at C6-7. There is mild disc space narrowing at C7-T1. There is facet hypertrophy at multiple levels bilaterally. No disc extrusion or stenosis. There are vertebral artery calcifications bilaterally. There is also calcification in each  carotid artery. IMPRESSION: CT head: Atrophy. 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 No acute infarct evident. No intracranial mass, hemorrhage, or extra-axial fluid collection. CT cervical spine: Mild anterior endplate concavity at T2, likely chronic. No cervical spine fracture or spondylolisthesis. 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 No disc extrusion or stenosis. Areas of atherosclerotic vascular calcification. Electronically Signed   By: Bretta Bang III M.D.   On: 10/13/2015 13:02    EKG: Independently reviewed. Sinus brady  Assessment/Plan Principal Problem:   Near syncope Active Problems:   Bradycardia   Hypertension   Dementia   1. Near syncope 1. Question cardiogenic in the setting of bradycardia 2. Will admit to med-tele 3. Cardiology consulted 4. Will check TSH 2. Bradycardia 1. HR to the 40's in the setting bradycardia 2. Electrolytes unremarkable 3. Dementia 1. Stable 4. HTN 1. BP stable and controlled 2. Cont monitor 3. Cont home meds 5. Hypothyroid 1. Will continue thyroid replacement 6. DVT prophylaxis 1. Heparin subQ  Code Status:  Full Family Communication: Pt in room Disposition Plan: Admit to med-tele   Zenovia Justman, Scheryl Marten [facility] Pager [phone]  If 7PM-7AM, please contact night-coverage [email or web address] Password [email or web address] 10/13/2015, 3:01 PM

## 2015-10-13 NOTE — Progress Notes (Signed)
CSW attempted to speak with patient. Physician was in with patient at the time.

## 2015-10-14 ENCOUNTER — Observation Stay (HOSPITAL_BASED_OUTPATIENT_CLINIC_OR_DEPARTMENT_OTHER): Payer: Medicare Other

## 2015-10-14 DIAGNOSIS — R55 Syncope and collapse: Secondary | ICD-10-CM | POA: Diagnosis not present

## 2015-10-14 DIAGNOSIS — F0390 Unspecified dementia without behavioral disturbance: Secondary | ICD-10-CM | POA: Diagnosis not present

## 2015-10-14 DIAGNOSIS — R001 Bradycardia, unspecified: Secondary | ICD-10-CM | POA: Diagnosis not present

## 2015-10-14 LAB — CBC
HEMATOCRIT: 40.3 % (ref 39.0–52.0)
HEMOGLOBIN: 13.6 g/dL (ref 13.0–17.0)
MCH: 29.8 pg (ref 26.0–34.0)
MCHC: 33.7 g/dL (ref 30.0–36.0)
MCV: 88.4 fL (ref 78.0–100.0)
Platelets: 156 10*3/uL (ref 150–400)
RBC: 4.56 MIL/uL (ref 4.22–5.81)
RDW: 13.7 % (ref 11.5–15.5)
WBC: 7.8 10*3/uL (ref 4.0–10.5)

## 2015-10-14 LAB — COMPREHENSIVE METABOLIC PANEL
ALT: 12 U/L — ABNORMAL LOW (ref 17–63)
ANION GAP: 11 (ref 5–15)
AST: 13 U/L — ABNORMAL LOW (ref 15–41)
Albumin: 3.7 g/dL (ref 3.5–5.0)
Alkaline Phosphatase: 86 U/L (ref 38–126)
BUN: 41 mg/dL — ABNORMAL HIGH (ref 6–20)
CHLORIDE: 101 mmol/L (ref 101–111)
CO2: 29 mmol/L (ref 22–32)
Calcium: 9 mg/dL (ref 8.9–10.3)
Creatinine, Ser: 1.13 mg/dL (ref 0.61–1.24)
GFR calc non Af Amer: >60 mL/min (ref >60)
Glucose, Bld: 64 mg/dL — ABNORMAL LOW (ref 65–99)
Potassium: 4.7 mmol/L (ref 3.5–5.1)
SODIUM: 141 mmol/L (ref 135–145)
Total Bilirubin: 0.6 mg/dL (ref 0.3–1.2)
Total Protein: 6.7 g/dL (ref 6.5–8.1)

## 2015-10-14 NOTE — Clinical Social Work Note (Signed)
Clinical Social Work Assessment  Patient Details  Name: Orest DikesBobby D Karel MRN: [id number] Date of Birth: 1961-05-15  Date of referral:  10/14/15               Reason for consult:  Discharge Planning                Permission sought to share information with:  Family Supports Permission granted to share information::     Name::        Agency::     Relationship::     Contact Information:     Housing/Transportation Living arrangements for the past 2 months:  Assisted Living Facility Source of Information:  Patient Patient Interpreter Needed:  None Criminal Activity/Legal Involvement Pertinent to Current Situation/Hospitalization:  No - Comment as needed Significant Relationships:  Siblings Lives with:  Facility Resident Do you feel safe going back to the place where you live?  Yes Need for family participation in patient care:  Yes (Comment)  Care giving concerns:  [facility]. Pt has a history of dementia and unable to provide history.   Social Worker assessment / plan:  CSW received referral that pt admitted from [facility].   CSW visited pt at beside. Pt confirmed that he is a resident at [facility]. Pt unable to provide more detailed history at this time. Per chart, pt has a diagnosis of dementia. Pt able to report to CSW that pt sister is primary support.   CSW contacted name listed in chart, but phone number disconnected.  CSW contacted [facility] who stated that pt sister called the facility today, but facility did not confirm phone number. Per ALF, ALF has a Marcell BarlowMargaret Sweeney listed as sister and provided CSW a phone number in which CSW called and number was disconnected. CSW notified [facility] that number was disconnected as well. [facility] states that facility can accept pt back when medically ready for discharge.  CSW to continue to follow to provide support and assist with pt return to  [facility] when medically ready for discharge.  Employment status:  Disabled (Comment on whether or not currently receiving Disability) Insurance information:  Medicare PT Recommendations:  Not assessed at this time Information / Referral to community resources:  Other (Comment Required) (referral back to [facility])  Patient/Family's Response to care:  Pt alert and oriented x 3 per chart, but unable to appropriately participate in assessment. CSW unable to reach pt sister due to disconnected phone numbers. [facility] spoke with pt sister today, but did not obtain contact information. [facility] states facility can accept pt back.  Patient/Family's Understanding of and Emotional Response to Diagnosis, Current Treatment, and Prognosis:  Unable to assess pt understanding of diagnosis and treatment plan. Unable to reach pt family.  Emotional Assessment Appearance:  Appears stated age Attitude/Demeanor/Rapport:  Other (pt appropriate) Affect (typically observed):  Pleasant (pt pleasantly confused) Orientation:  Oriented to Self, Oriented to Place, Oriented to  Time Alcohol / Substance use:  Not Applicable Psych involvement (Current and /or in the community):  No (Comment)  Discharge Needs  Concerns to be addressed:  Discharge Planning Concerns Readmission within the last 30 days:  No Current discharge risk:  None Barriers to Discharge:  Continued Medical Work up   Orson EvaKIDD, SUZANNA A, LCSW 10/14/2015, 5:30 PM  Coverage for Lincoln MaxinKelly Harrison, LCSW [phone]

## 2015-10-14 NOTE — Progress Notes (Signed)
Subjective: No complaints had just finished breakfast  Objective: Vital signs in last 24 hours: Temp:  [97.5 F (36.4 C)-98.3 F (36.8 C)] 97.8 F (36.6 C) (12/22 0443) Pulse Rate:  [41-56] 51 (12/22 0443) Resp:  [14-18] 18 (12/22 0443) BP: (94-120)/(53-72) 94/57 mmHg (12/22 0443) SpO2:  [96 %-100 %] 96 % (12/22 0443) Weight:  [148 lb 2.4 oz (67.2 kg)] 148 lb 2.4 oz (67.2 kg) (12/21 1658) Weight change:  Last BM Date: 10/12/15 Intake/Output from previous day: -160 12/21 0701 - 12/22 0700 In: 240 [P.O.:240] Out: 400 [Urine:400] Intake/Output this shift:    PE: General:Pleasant affect, NAD Skin:Warm and dry, brisk capillary refill HEENT:normocephalic, sclera clear, mucus membranes moist Heart:S1S2 RRR without murmur, gallup, rub or click Lungs:clear without rales, rhonchi, or wheezes ZOX:WRUEAbd:soft, non tender, + BS, do not palpate liver spleen or masses Ext:no lower ext edema, 2+ pedal pulses, 2+ radial pulses Neuro:alert and oriented, MAE, follows commands, + facial symmetry Tele: SB 41 mostly since admit now this AM up to 50-60   Lab Results:  Recent Labs  10/13/15 1324 10/14/15 0346  WBC 6.3 7.8  HGB 13.8 13.6  HCT 41.6 40.3  PLT 137* 156   BMET  Recent Labs  10/13/15 1324 10/14/15 0346  NA 141 141  K 5.3* 4.7  CL 102 101  CO2 32 29  GLUCOSE 82 64*  BUN 34* 41*  CREATININE 0.93 1.13  CALCIUM 9.3 9.0    Recent Labs  10/13/15 1324  TROPONINI <0.03    No results found for: CHOL, HDL, LDLCALC, LDLDIRECT, TRIG, CHOLHDL No results found for: [id number]   Lab Results  Component Value Date   TSH 0.867 10/13/2015    Hepatic Function Panel  Recent Labs  10/14/15 0346  PROT 6.7  ALBUMIN 3.7  AST 13*  ALT 12*  ALKPHOS 86  BILITOT 0.6   No results for input(s): CHOL in the last 72 hours. No results for input(s): PROTIME in the last 72 hours.     Studies/Results: Ct Head Wo Contrast  10/13/2015  CLINICAL DATA:  Pain following fall  EXAM: CT HEAD WITHOUT CONTRAST CT CERVICAL SPINE WITHOUT CONTRAST TECHNIQUE: Multidetector CT imaging of the head and cervical spine was performed following the standard protocol without intravenous contrast. Multiplanar CT image reconstructions of the cervical spine were also generated. COMPARISON:  Head CT July 02, 2007 FINDINGS: CT HEAD FINDINGS Moderate diffuse atrophy is stable. There is no intracranial mass, hemorrhage, extra-axial fluid collection, or midline shift. There is evidence of a prior infarct in the posterior inferior cerebellar region on the left. There is a prior infarct in the anterior left thalamus, stable. There is an infarct involving much of the anterior limb of the left internal capsule and a portion of the anterior limb of the left external capsule, stable. There is a prior infarct in the right anterior lentiform nucleus. There is a prior small infarct in the mid right cerebellum at the posterior most aspect of the right dentate nucleus. No new gray-white compartment lesions are identified. No acute infarct evident. There is mild generalized periventricular small vessel disease. Bony calvarium appears intact. Mastoid air cells are clear. No intraorbital lesions are identified. CT CERVICAL SPINE FINDINGS There is no cervical spine fracture or spondylolisthesis. There is slight superior endplate wedging at T2, likely chronic. Prevertebral soft tissues and predental space regions are normal. There is calcification in the anterior longitudinal ligament at C6-7. There is mild disc  space narrowing at C7-T1. There is facet hypertrophy at multiple levels bilaterally. No disc extrusion or stenosis. There are vertebral artery calcifications bilaterally. There is also calcification in each carotid artery. IMPRESSION: CT head: Atrophy. 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 No acute infarct evident. No intracranial mass, hemorrhage, or extra-axial fluid collection. CT  cervical spine: Mild anterior endplate concavity at T2, likely chronic. No cervical spine fracture or spondylolisthesis. 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 No disc extrusion or stenosis. Areas of atherosclerotic vascular calcification. Electronically Signed   By: Bretta Bang III M.D.   On: 10/13/2015 13:02   Ct Cervical Spine Wo Contrast  10/13/2015  CLINICAL DATA:  Pain following fall EXAM: CT HEAD WITHOUT CONTRAST CT CERVICAL SPINE WITHOUT CONTRAST TECHNIQUE: Multidetector CT imaging of the head and cervical spine was performed following the standard protocol without intravenous contrast. Multiplanar CT image reconstructions of the cervical spine were also generated. COMPARISON:  Head CT July 02, 2007 FINDINGS: CT HEAD FINDINGS Moderate diffuse atrophy is stable. There is no intracranial mass, hemorrhage, extra-axial fluid collection, or midline shift. There is evidence of a prior infarct in the posterior inferior cerebellar region on the left. There is a prior infarct in the anterior left thalamus, stable. There is an infarct involving much of the anterior limb of the left internal capsule and a portion of the anterior limb of the left external capsule, stable. There is a prior infarct in the right anterior lentiform nucleus. There is a prior small infarct in the mid right cerebellum at the posterior most aspect of the right dentate nucleus. No new gray-white compartment lesions are identified. No acute infarct evident. There is mild generalized periventricular small vessel disease. Bony calvarium appears intact. Mastoid air cells are clear. No intraorbital lesions are identified. CT CERVICAL SPINE FINDINGS There is no cervical spine fracture or spondylolisthesis. There is slight superior endplate wedging at T2, likely chronic. Prevertebral soft tissues and predental space regions are normal. There is calcification in the anterior longitudinal ligament  at C6-7. There is mild disc space narrowing at C7-T1. There is facet hypertrophy at multiple levels bilaterally. No disc extrusion or stenosis. There are vertebral artery calcifications bilaterally. There is also calcification in each carotid artery. IMPRESSION: CT head: Atrophy. 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 No acute infarct evident. No intracranial mass, hemorrhage, or extra-axial fluid collection. CT cervical spine: Mild anterior endplate concavity at T2, likely chronic. No cervical spine fracture or spondylolisthesis. 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 No disc extrusion or stenosis. Areas of atherosclerotic vascular calcification. Electronically Signed   By: Bretta Bang III M.D.   On: 10/13/2015 13:02    Medications: I have reviewed the patient's current medications. Scheduled Meds: . aspirin EC  81 mg Oral Daily  . atorvastatin  10 mg Oral QHS  . cholecalciferol  2,000 Units Oral Daily  . divalproex  750 mg Oral QHS  . heparin  5,000 Units Subcutaneous 3 times per day  . levothyroxine  25 mcg Oral QAC breakfast  . lisinopril  2.5 mg Oral Daily  . QUEtiapine  50 mg Oral QHS  . sertraline  75 mg Oral Daily  . sodium chloride  3 mL Intravenous Q12H   Continuous Infusions:  PRN Meds:.acetaminophen **OR** acetaminophen, ondansetron **OR** ondansetron (ZOFRAN) IV  Assessment/Plan: 54 year old gentleman brought in from [place] after he had a fall today the patient has a history of dementia- HR  was in the 40s ? Syncope vs. Mechanical fall--pt was on donepezil  Principal Problem:   Near syncope Active Problems:   Bradycardia   Hypertension   Dementia   Syncope, cardiogenic  1. Syncope vs. Mechanical fall -pt has no recollection  2. Marked S brady at 40 on EKG on admit over the night HR now up to 50-60 continue to monitor     -troponin negative no MI  3. 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 There is also marked atrophy. We will check an echo to rule out embolic source-pending  4.  Dementia--lives at [facility]  5. BP 101-94/53-57 on lisinopril 2.5 check orthostatic BP    Time spent with pt. : 15 minutes. [facility]  Nurse Practitioner Certified Pager [phone] or after 5pm and on weekends call [phone] 10/14/2015, 9:28 AM The patient has had no more dizzy spells or falls.  He denies any chest pain or shortness of breath.  His heart rate while asleep is 54 and with being awakened and conversing and is now 74 bpm. His echocardiogram is pending.  If echo is satisfactory, no further inpatient cardiac workup anticipated.  I suspect that the donepezil was contributing to his bradycardia and I would leave him off of that when he gets discharged.  If he has further spells of dizziness or syncope after discharge, he might benefit from an outpatient event monitor.

## 2015-10-14 NOTE — Progress Notes (Signed)
Report received from I. Oraegbunam, RN. No change in previous assessment. Donald Montoya 

## 2015-10-14 NOTE — Progress Notes (Addendum)
[facility] PROGRESS NOTE  Donald Montoya:[id number] DOB: 1960-11-24 DOA: 10/13/2015 PCP: Dorrene German, MD  HPI/Brief narrative 54 y.o. male with a hx of dementia who presents from [place] for falls and mild bradycardia to the 60's. In the ED, pt was found to have bradycardia to the 40's. EDP consulted Cardiology. Hospitalist was consulted for consideration for admission  Assessment/Plan: 1. Near syncope 1. Question cardiogenic in the setting of bradycardia 2. Cardiology consulted, appreciate input 3. TSH normal 2. Bradycardia 1. Presenting HR to the 40's in the setting bradycardia 2. Electrolytes unremarkable 3. Suspect secondary to donepezil. Improved after stopping this 4. Per Cardiology, follow up 2d echo and if unremarkable, then no further cardiac w/u needed 3. Dementia 1. Stable 2. Per above, stopped donepezil given concerns of bradycardia 4. HTN 1. BP remains stable and controlled 2. Cont monitor 3. Cont home meds 5. Hypothyroid 1. Will continue thyroid replacement 2. TSH normal 6. DVT prophylaxis 1. Heparin subQ  Code Status: Full Family Communication: Pt in room Disposition Plan: Possible return to facility 12/23 if echo unremarkable  Consultants:  Cardiology  Procedures:  2d echo  Antibiotics: Anti-infectives    None      HPI/Subjective: No complaints. Pt pleasantly confused  Objective: Filed Vitals:   10/13/15 2035 10/14/15 0443 10/14/15 1033 10/14/15 1308  BP: 101/53 94/57 113/72 102/59  Pulse: 56 51  57  Temp: 97.5 F (36.4 C) 97.8 F (36.6 C)  97.9 F (36.6 C)  TempSrc: Oral Oral  Oral  Resp: Height:      Weight:      SpO2: 98% 96%  95%    Intake/Output Summary (Last 24 hours) at 10/14/15 1721 Last data filed at 10/14/15 1309  Gross per 24 hour  Intake    480 ml  Output    625 ml  Net   -145 ml   Filed Weights   10/13/15 1658  Weight: 67.2 kg (148 lb 2.4 oz)    Exam:   General:   Awake, in nad  Cardiovascular: regular, s1, s2  Respiratory: normal resp effort, no wheezing  Abdomen: soft, nondistended  Musculoskeletal: perfused, no clubbing   Data Reviewed: Basic Metabolic Panel:  Recent Labs Lab 10/13/15 1324 10/14/15 0346  NA 141 141  K 5.3* 4.7  CL 102 101  CO2 32 29  GLUCOSE 82 64*  BUN 34* 41*  CREATININE 0.93 1.13  CALCIUM 9.3 9.0  MG 2.2  --    Liver Function Tests:  Recent Labs Lab 10/14/15 0346  AST 13*  ALT 12*  ALKPHOS 86  BILITOT 0.6  PROT 6.7  ALBUMIN 3.7   No results for input(s): LIPASE, AMYLASE in the last 168 hours. No results for input(s): AMMONIA in the last 168 hours. CBC:  Recent Labs Lab 10/13/15 1324 10/14/15 0346  WBC 6.3 7.8  NEUTROABS 4.5  --   HGB 13.8 13.6  HCT 41.6 40.3  MCV 90.6 88.4  PLT 137* 156   Cardiac Enzymes:  Recent Labs Lab 10/13/15 1324  TROPONINI <0.03   BNP (last 3 results) No results for input(s): BNP in the last 8760 hours.  ProBNP (last 3 results) No results for input(s): PROBNP in the last 8760 hours.  CBG: No results for input(s): GLUCAP in the last 168 hours.  Recent Results (from the past 240 hour(s))  MRSA PCR Screening     Status: None   Collection Time: 10/13/15  5:05 PM  Result  Value Ref Range Status   MRSA by PCR NEGATIVE NEGATIVE Final    Comment:        The GeneXpert MRSA Assay (FDA approved for NASAL specimens only), is one component of a comprehensive MRSA colonization surveillance program. It is not intended to diagnose MRSA infection nor to guide or monitor treatment for MRSA infections.      Studies: Ct Head Wo Contrast  10/13/2015  CLINICAL DATA:  Pain following fall EXAM: CT HEAD WITHOUT CONTRAST CT CERVICAL SPINE WITHOUT CONTRAST TECHNIQUE: Multidetector CT imaging of the head and cervical spine was performed following the standard protocol without intravenous contrast. Multiplanar CT image reconstructions of the cervical spine were also  generated. COMPARISON:  Head CT July 02, 2007 FINDINGS: CT HEAD FINDINGS Moderate diffuse atrophy is stable. There is no intracranial mass, hemorrhage, extra-axial fluid collection, or midline shift. There is evidence of a prior infarct in the posterior inferior cerebellar region on the left. There is a prior infarct in the anterior left thalamus, stable. There is an infarct involving much of the anterior limb of the left internal capsule and a portion of the anterior limb of the left external capsule, stable. There is a prior infarct in the right anterior lentiform nucleus. There is a prior small infarct in the mid right cerebellum at the posterior most aspect of the right dentate nucleus. No new gray-white compartment lesions are identified. No acute infarct evident. There is mild generalized periventricular small vessel disease. Bony calvarium appears intact. Mastoid air cells are clear. No intraorbital lesions are identified. CT CERVICAL SPINE FINDINGS There is no cervical spine fracture or spondylolisthesis. There is slight superior endplate wedging at T2, likely chronic. Prevertebral soft tissues and predental space regions are normal. There is calcification in the anterior longitudinal ligament at C6-7. There is mild disc space narrowing at C7-T1. There is facet hypertrophy at multiple levels bilaterally. No disc extrusion or stenosis. There are vertebral artery calcifications bilaterally. There is also calcification in each carotid artery. IMPRESSION: CT head: Atrophy. 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 No acute infarct evident. No intracranial mass, hemorrhage, or extra-axial fluid collection. CT cervical spine: Mild anterior endplate concavity at T2, likely chronic. No cervical spine fracture or spondylolisthesis. 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 No disc extrusion or stenosis. Areas of atherosclerotic vascular calcification.  Electronically Signed   By: Bretta Bang III M.D.   On: 10/13/2015 13:02   Ct Cervical Spine Wo Contrast  10/13/2015  CLINICAL DATA:  Pain following fall EXAM: CT HEAD WITHOUT CONTRAST CT CERVICAL SPINE WITHOUT CONTRAST TECHNIQUE: Multidetector CT imaging of the head and cervical spine was performed following the standard protocol without intravenous contrast. Multiplanar CT image reconstructions of the cervical spine were also generated. COMPARISON:  Head CT July 02, 2007 FINDINGS: CT HEAD FINDINGS Moderate diffuse atrophy is stable. There is no intracranial mass, hemorrhage, extra-axial fluid collection, or midline shift. There is evidence of a prior infarct in the posterior inferior cerebellar region on the left. There is a prior infarct in the anterior left thalamus, stable. There is an infarct involving much of the anterior limb of the left internal capsule and a portion of the anterior limb of the left external capsule, stable. There is a prior infarct in the right anterior lentiform nucleus. There is a prior small infarct in the mid right cerebellum at the posterior most aspect of the right dentate nucleus. No new gray-white compartment lesions are identified. No  acute infarct evident. There is mild generalized periventricular small vessel disease. Bony calvarium appears intact. Mastoid air cells are clear. No intraorbital lesions are identified. CT CERVICAL SPINE FINDINGS There is no cervical spine fracture or spondylolisthesis. There is slight superior endplate wedging at T2, likely chronic. Prevertebral soft tissues and predental space regions are normal. There is calcification in the anterior longitudinal ligament at C6-7. There is mild disc space narrowing at C7-T1. There is facet hypertrophy at multiple levels bilaterally. No disc extrusion or stenosis. There are vertebral artery calcifications bilaterally. There is also calcification in each carotid artery. IMPRESSION: CT head: Atrophy.  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 No acute infarct evident. No intracranial mass, hemorrhage, or extra-axial fluid collection. CT cervical spine: Mild anterior endplate concavity at T2, likely chronic. No cervical spine fracture or spondylolisthesis. 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 No disc extrusion or stenosis. Areas of atherosclerotic vascular calcification. Electronically Signed   By: Bretta BangWilliam  Woodruff III M.D.   On: 10/13/2015 13:02    Scheduled Meds: . aspirin EC  81 mg Oral Daily  . atorvastatin  10 mg Oral QHS  . cholecalciferol  2,000 Units Oral Daily  . divalproex  750 mg Oral QHS  . heparin  5,000 Units Subcutaneous 3 times per day  . levothyroxine  25 mcg Oral QAC breakfast  . lisinopril  2.5 mg Oral Daily  . QUEtiapine  50 mg Oral QHS  . sertraline  75 mg Oral Daily  . sodium chloride  3 mL Intravenous Q12H   Continuous Infusions:   Principal Problem:   Near syncope Active Problems:   Bradycardia   Hypertension   Dementia   Syncope, cardiogenic    CHIU, STEPHEN K  [facility] Pager [phone]. If 7PM-7AM, please contact night-coverage at [email or web address], password [email or web address] 10/14/2015, 5:21 PM

## 2015-10-14 NOTE — NC FL2 (Deleted)
[place] MEDICAID FL2 LEVEL OF CARE SCREENING TOOL     IDENTIFICATION  Patient Name: ESPIRIDION Montoya Birthdate: 01-Jan-1961 Sex: male Admission Date (Current Location): 10/13/2015  [place] and [place] Number:  Haynes Bast [id number] M Facility and Address:  [facility],  [address], [place] [address]      Provider Number: [id number]  Attending Physician Name and Address:  Jerald Kief, MD  Relative Name and Phone Number:       Current Level of Care: Hospital Recommended Level of Care: Assisted Living Facility Prior Approval Number:    Date Approved/Denied:   PASRR Number: [id number]  Discharge Plan: Other (Comment) (Assisted Living Facility)    Current Diagnoses: Patient Active Problem List   Diagnosis Date Noted  . Near syncope 10/13/2015  . Bradycardia 10/13/2015  . Hypertension 10/13/2015  . Dementia 10/13/2015  . Syncope, cardiogenic 10/13/2015    Orientation RESPIRATION BLADDER Height & Weight    Self, Situation, Place  Normal Incontinent  (188 cm) 148 lbs.  BEHAVIORAL SYMPTOMS/MOOD NEUROLOGICAL BOWEL NUTRITION STATUS   (no behaviors)  (NONE) Continent Diet (Diet Regular)  AMBULATORY STATUS COMMUNICATION OF NEEDS Skin   Limited Assist Verbally Normal                       Personal Care Assistance Level of Assistance  Bathing, Feeding, Dressing Bathing Assistance: Limited assistance Feeding assistance: Independent Dressing Assistance: Limited assistance     Functional Limitations Info  Sight, Hearing, Speech Sight Info: Impaired Hearing Info: Adequate Speech Info: Adequate    SPECIAL CARE FACTORS FREQUENCY                       Contractures Contractures Info: Not present    Additional Factors Info  Code Status, Allergies Code Status Info: FULL code status Allergies Info: No Known Allergies           Current Medications (10/14/2015):  This is the current hospital active medication list Current  Facility-Administered Medications  Medication Dose Route Frequency Provider Last Rate Last Dose  . acetaminophen (TYLENOL) tablet 650 mg  650 mg Oral Q6H PRN Jerald Kief, MD       Or  . acetaminophen (TYLENOL) suppository 650 mg  650 mg Rectal Q6H PRN Jerald Kief, MD      . aspirin EC tablet 81 mg  81 mg Oral Daily Jerald Kief, MD   81 mg at 10/14/15 1033  . atorvastatin (LIPITOR) tablet 10 mg  10 mg Oral QHS Jerald Kief, MD   10 mg at 10/13/15 2206  . cholecalciferol (VITAMIN D) tablet 2,000 Units  2,000 Units Oral Daily Jerald Kief, MD   2,000 Units at 10/14/15 1033  . divalproex (DEPAKOTE ER) 24 hr tablet 750 mg  750 mg Oral QHS Jerald Kief, MD   750 mg at 10/13/15 2206  . heparin injection 5,000 Units  5,000 Units Subcutaneous 3 times per day Jerald Kief, MD   5,000 Units at 10/14/15 1507  . levothyroxine (SYNTHROID, LEVOTHROID) tablet 25 mcg  25 mcg Oral QAC breakfast Jerald Kief, MD   25 mcg at 10/14/15 [phone]  . lisinopril (PRINIVIL,ZESTRIL) tablet 2.5 mg  2.5 mg Oral Daily Jerald Kief, MD   2.5 mg at 10/14/15 1033  . ondansetron (ZOFRAN) tablet 4 mg  4 mg Oral Q6H PRN Jerald Kief, MD       Or  . ondansetron (  ZOFRAN) injection 4 mg  4 mg Intravenous Q6H PRN Jerald KiefStephen K Chiu, MD      . QUEtiapine (SEROQUEL) tablet 50 mg  50 mg Oral QHS Jerald KiefStephen K Chiu, MD   50 mg at 10/13/15 2208  . sertraline (ZOLOFT) tablet 75 mg  75 mg Oral Daily Jerald KiefStephen K Chiu, MD   75 mg at 10/14/15 1033  . sodium chloride 0.9 % injection 3 mL  3 mL Intravenous Q12H Jerald KiefStephen K Chiu, MD   3 mL at 10/14/15 1034     Discharge Medications: Please see discharge summary for a list of discharge medications.  Relevant Imaging Results:  Relevant Lab Results:   Additional Information SSN: [id number]  KIDD, Selena LesserSUZANNA A, LCSW

## 2015-10-14 NOTE — Progress Notes (Signed)
*  PRELIMINARY RESULTS* Echocardiogram 2D Echocardiogram has been performed.  Jeryl Columbialliott, Mataya Kilduff 10/14/2015, 4:34 PM

## 2015-10-15 DIAGNOSIS — R55 Syncope and collapse: Secondary | ICD-10-CM | POA: Diagnosis not present

## 2015-10-15 DIAGNOSIS — R001 Bradycardia, unspecified: Secondary | ICD-10-CM | POA: Diagnosis not present

## 2015-10-15 DIAGNOSIS — I10 Essential (primary) hypertension: Secondary | ICD-10-CM | POA: Diagnosis not present

## 2015-10-15 NOTE — Progress Notes (Signed)
Patient for d/c today to ALF bed at [facility] as p[facility]. He is  agreeable to this plan- CSW and ALF have unable to reach family- will plan transfer via EMS. Reece LevyJanet Gustie Bobb, MSW, Theresia MajorsLCSWA [phone]

## 2015-10-15 NOTE — NC FL2 (Signed)
[place] MEDICAID FL2 LEVEL OF CARE SCREENING TOOL     IDENTIFICATION  Patient Name: Donald Montoya Birthdate: 08/13/1961 Sex: male Admission Date (Current Location): 10/13/2015  [place] and [place] Number:  Haynes Bast [id number] Facility and Address:  [facility],  [address], [place] [address]      Provider Number: [id number]  Attending Physician Name and Address:  Jerald Kief, MD  Relative Name and Phone Number:       Current Level of Care: Hospital Recommended Level of Care: Assisted Living Facility Prior Approval Number:    Date Approved/Denied:   PASRR Number: [id number]  Discharge Plan: Other (Comment) (Assisted Living Facility)    Current Diagnoses: Patient Active Problem List   Diagnosis Date Noted  . Near syncope 10/13/2015  . Bradycardia 10/13/2015  . Hypertension 10/13/2015  . Dementia 10/13/2015  . Syncope, cardiogenic 10/13/2015    Orientation RESPIRATION BLADDER Height & Weight    Self, Situation, Place  Normal Incontinent  (188 cm) 148 lbs.  BEHAVIORAL SYMPTOMS/MOOD NEUROLOGICAL BOWEL NUTRITION STATUS   (no behaviors)  (NONE) Continent Diet (Diet Regular)  AMBULATORY STATUS COMMUNICATION OF NEEDS Skin   Limited Assist Verbally Normal                       Personal Care Assistance Level of Assistance  Bathing, Feeding, Dressing Bathing Assistance: Limited assistance Feeding assistance: Independent Dressing Assistance: Limited assistance     Functional Limitations Info  Sight, Hearing, Speech Sight Info: Impaired Hearing Info: Adequate Speech Info: Adequate    SPECIAL CARE FACTORS FREQUENCY                       Contractures Contractures Info: Not present    Additional Factors Info  Code Status, Allergies Code Status Info: FULL code status Allergies Info: No Known Allergies              Discharge Medications: Please see discharge summary for a list of discharge  medications.  Medication List    STOP taking these medications       donepezil 10 MG tablet  Commonly known as: ARICEPT      TAKE these medications       acetaminophen 500 MG tablet  Commonly known as: TYLENOL  Take 500-1,000 mg by mouth every 4 (four) hours as needed for moderate pain (do not exceed  in 24 hours). Take  every 4 hours as needed for fever; take  every 6 hours as needed for pain. Do not exceed  in 24 hours     aspirin EC 81 MG tablet  Take 81 mg by mouth daily.     atorvastatin 10 MG tablet  Commonly known as: LIPITOR  Take 10 mg by mouth at bedtime.     BALMEX EX  Apply 1 application topically 4 (four) times daily as needed (apply to buttocks and groin with each incontinent change).     divalproex 250 MG 24 hr tablet  Commonly known as: DEPAKOTE ER  Take 750 mg by mouth at bedtime.     GERI-LANTA 200-200-20 MG/5ML suspension  Generic drug: alum & mag hydroxide-simeth  Take 30 mLs by mouth every 6 (six) hours as needed for indigestion or heartburn (no more than 4 doses in 24 hours).     guaifenesin 100 MG/5ML syrup  Commonly known as: ROBITUSSIN  Take 200 mg by mouth every 6 (six) hours as needed for cough (no more than  4 doses in 24 hours).     levothyroxine 25 MCG tablet  Commonly known as: SYNTHROID, LEVOTHROID  Take 25 mcg by mouth daily before breakfast.     lisinopril 2.5 MG tablet  Commonly known as: PRINIVIL,ZESTRIL  Take 2.5 mg by mouth daily.     loperamide 2 MG capsule  Commonly known as: IMODIUM  Take 2 mg by mouth every 4 (four) hours as needed for diarrhea or loose stools (no more than 8 doses in 24 hours).     magnesium hydroxide 400 MG/5ML suspension  Commonly known as: MILK OF MAGNESIA  Take 30 mLs by mouth daily as needed for mild constipation or moderate constipation.     neomycin-bacitracin-polymyxin [phone] ointment  Apply  1 application topically 4 (four) times daily as needed (for skin abraisions, minor tears).     QUEtiapine 50 MG tablet  Commonly known as: SEROQUEL  Take 50 mg by mouth at bedtime.     sertraline 50 MG tablet  Commonly known as: ZOLOFT  Take 75 mg by mouth daily.     triamcinolone cream 0.1 %  Commonly known as: KENALOG  Apply 1 application topically 2 (two) times daily as needed (to rash on legs for flareups).     Vitamin D 2000 UNITS Caps  Take 2,000 Units by mouth daily.     white petrolatum Gel  Commonly known as: VASELINE  Apply 1 application topically 3 (three) times daily. apply topically to buttocks           Relevant Imaging Results:  Relevant Lab Results:   Additional Information SSN: [id number]  Arlyss RepressHarrison, Taylen Wendland F, LCSW

## 2015-10-15 NOTE — Progress Notes (Signed)
Patient discharged back to [facility] via EMS, alert, no pain/distress noted, skin intact, no wound noted. Tried to call report to facility but unable to speak to anyone. Patient's discharge packet prepared by CSW and given to EMS for the facility.

## 2015-10-15 NOTE — Discharge Summary (Addendum)
Physician Discharge Summary  Donald DikesBobby D Maziarz WUJ:[id number] DOB: Mar 14, 1961 DOA: 10/13/2015  PCP: Dorrene GermanAVBUERE,EDWIN A, MD  Admit date: 10/13/2015 Discharge date: 10/15/2015  Time spent: 20 minutes  Recommendations for Outpatient Follow-up:  1. Follow up with PCP in 1-2 weeks  2. Please note that Aricept was d/c'd as this was thought to have contributed to patient's presenting bradycardia  Discharge Diagnoses:  Principal Problem:   Near syncope Active Problems:   Bradycardia   Hypertension   Dementia   Syncope, cardiogenic   Discharge Condition: Stable  Diet recommendation: Heart healthy  Filed Weights   10/13/15 1658  Weight: 67.2 kg (148 lb 2.4 oz)    History of present illness:  Please review dictated H and P from 12/21 for details. Briefly, 54 y.o. male with a hx of dementia who presents from [facility] for falls and mild bradycardia to the 60's. In the ED, pt was found to have bradycardia to the 40's. EDP consulted Cardiology. Hospitalist was consulted for consideration for admission  Hospital Course:  1. Near syncope 1. Question cardiogenic in the setting of bradycardia 2. Cardiology consulted, appreciate input. See below 3. TSH normal 2. Bradycardia 1. Presenting HR to the 40's in the setting bradycardia 2. Electrolytes were unremarkable 3. Cardiology suspects bradycardia secondary to donepezil. Improved after stopping this 4. 2d echo was unremarkable and patient was subsequently cleared for d/c by Cardiology 3. Dementia 1. Stable 2. Per above, stopped donepezil given concerns of bradycardia 4. HTN 1. BP remains stable and controlled 2. Cont monitor 3. Cont home meds 5. Hypothyroid 1. Will continue thyroid replacement 2. TSH normal 6. DVT prophylaxis 1. Heparin subQ while inpatient  Consultations:  Cardiology  Discharge Exam: Filed Vitals:   10/15/15 0001 10/15/15 0454 10/15/15 0918 10/15/15 1309  BP: 121/71 134/73 126/79 145/75  Pulse: 58 69   69  Temp: 98.1 F (36.7 C) 97.8 F (36.6 C)  97.9 F (36.6 C)  TempSrc: Oral Oral  Oral  Resp: 16 16  17   Height:      Weight:      SpO2: 96% 97%  98%    General: awake, in nad Cardiovascular: regular, s1, s2 Respiratory: normal resp effort, no wheezing  Discharge Instructions     Medication List    STOP taking these medications        donepezil 10 MG tablet  Commonly known as:  ARICEPT      TAKE these medications        acetaminophen 500 MG tablet  Commonly known as:  TYLENOL  Take 500-1,000 mg by mouth every 4 (four) hours as needed for moderate pain (do not exceed 2000mg  in 24 hours). Take 500mg  every 4 hours as needed for fever; take 1000mg  every 6 hours as needed for pain. Do not exceed 2000mg  in 24 hours     aspirin EC 81 MG tablet  Take 81 mg by mouth daily.     atorvastatin 10 MG tablet  Commonly known as:  LIPITOR  Take 10 mg by mouth at bedtime.     BALMEX EX  Apply 1 application topically 4 (four) times daily as needed (apply to buttocks and groin with each incontinent change).     divalproex 250 MG 24 hr tablet  Commonly known as:  DEPAKOTE ER  Take 750 mg by mouth at bedtime.     GERI-LANTA 200-200-20 MG/5ML suspension  Generic drug:  alum & mag hydroxide-simeth  Take 30 mLs by mouth every 6 (six) hours  as needed for indigestion or heartburn (no more than 4 doses in 24 hours).     guaifenesin 100 MG/5ML syrup  Commonly known as:  ROBITUSSIN  Take 200 mg by mouth every 6 (six) hours as needed for cough (no more than 4 doses in 24 hours).     levothyroxine 25 MCG tablet  Commonly known as:  SYNTHROID, LEVOTHROID  Take 25 mcg by mouth daily before breakfast.     lisinopril 2.5 MG tablet  Commonly known as:  PRINIVIL,ZESTRIL  Take 2.5 mg by mouth daily.     loperamide 2 MG capsule  Commonly known as:  IMODIUM  Take 2 mg by mouth every 4 (four) hours as needed for diarrhea or loose stools (no more than 8 doses in 24 hours).     magnesium  hydroxide 400 MG/5ML suspension  Commonly known as:  MILK OF MAGNESIA  Take 30 mLs by mouth daily as needed for mild constipation or moderate constipation.     neomycin-bacitracin-polymyxin [phone] ointment  Apply 1 application topically 4 (four) times daily as needed (for skin abraisions, minor tears).     QUEtiapine 50 MG tablet  Commonly known as:  SEROQUEL  Take 50 mg by mouth at bedtime.     sertraline 50 MG tablet  Commonly known as:  ZOLOFT  Take 75 mg by mouth daily.     triamcinolone cream 0.1 %  Commonly known as:  KENALOG  Apply 1 application topically 2 (two) times daily as needed (to rash on legs for flareups).     Vitamin D 2000 UNITS Caps  Take 2,000 Units by mouth daily.     white petrolatum Gel  Commonly known as:  VASELINE  Apply 1 application topically 3 (three) times daily. apply topically to buttocks       No Known Allergies Follow-up Information    Follow up with Dorrene German, MD. Go on 11/19/2015.   Specialty:  Internal Medicine   Why:  You have been scheduled an appointment to see Dr Dorma Russell on November 19 2015 at 11 am please bring your ID and insurance cards    Contact information:   [address] [place] [address] [phone]        The results of significant diagnostics from this hospitalization (including imaging, microbiology, ancillary and laboratory) are listed below for reference.    Significant Diagnostic Studies: Ct Head Wo Contrast  10/13/2015  CLINICAL DATA:  Pain following fall EXAM: CT HEAD WITHOUT CONTRAST CT CERVICAL SPINE WITHOUT CONTRAST TECHNIQUE: Multidetector CT imaging of the head and cervical spine was performed following the standard protocol without intravenous contrast. Multiplanar CT image reconstructions of the cervical spine were also generated. COMPARISON:  Head CT July 02, 2007 FINDINGS: CT HEAD FINDINGS Moderate diffuse atrophy is stable. There is no intracranial mass, hemorrhage, extra-axial fluid  collection, or midline shift. There is evidence of a prior infarct in the posterior inferior cerebellar region on the left. There is a prior infarct in the anterior left thalamus, stable. There is an infarct involving much of the anterior limb of the left internal capsule and a portion of the anterior limb of the left external capsule, stable. There is a prior infarct in the right anterior lentiform nucleus. There is a prior small infarct in the mid right cerebellum at the posterior most aspect of the right dentate nucleus. No new gray-white compartment lesions are identified. No acute infarct evident. There is mild generalized periventricular small vessel disease. Bony calvarium appears  intact. Mastoid air cells are clear. No intraorbital lesions are identified. CT CERVICAL SPINE FINDINGS There is no cervical spine fracture or spondylolisthesis. There is slight superior endplate wedging at T2, likely chronic. Prevertebral soft tissues and predental space regions are normal. There is calcification in the anterior longitudinal ligament at C6-7. There is mild disc space narrowing at C7-T1. There is facet hypertrophy at multiple levels bilaterally. No disc extrusion or stenosis. There are vertebral artery calcifications bilaterally. There is also calcification in each carotid artery. IMPRESSION: CT head: Atrophy. 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 No acute infarct evident. No intracranial mass, hemorrhage, or extra-axial fluid collection. CT cervical spine: Mild anterior endplate concavity at T2, likely chronic. No cervical spine fracture or spondylolisthesis. 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 No disc extrusion or stenosis. Areas of atherosclerotic vascular calcification. Electronically Signed   By: Bretta Bang III M.D.   On: 10/13/2015 13:02   Ct Cervical Spine Wo Contrast  10/13/2015  CLINICAL DATA:  Pain following fall EXAM: CT HEAD  WITHOUT CONTRAST CT CERVICAL SPINE WITHOUT CONTRAST TECHNIQUE: Multidetector CT imaging of the head and cervical spine was performed following the standard protocol without intravenous contrast. Multiplanar CT image reconstructions of the cervical spine were also generated. COMPARISON:  Head CT July 02, 2007 FINDINGS: CT HEAD FINDINGS Moderate diffuse atrophy is stable. There is no intracranial mass, hemorrhage, extra-axial fluid collection, or midline shift. There is evidence of a prior infarct in the posterior inferior cerebellar region on the left. There is a prior infarct in the anterior left thalamus, stable. There is an infarct involving much of the anterior limb of the left internal capsule and a portion of the anterior limb of the left external capsule, stable. There is a prior infarct in the right anterior lentiform nucleus. There is a prior small infarct in the mid right cerebellum at the posterior most aspect of the right dentate nucleus. No new gray-white compartment lesions are identified. No acute infarct evident. There is mild generalized periventricular small vessel disease. Bony calvarium appears intact. Mastoid air cells are clear. No intraorbital lesions are identified. CT CERVICAL SPINE FINDINGS There is no cervical spine fracture or spondylolisthesis. There is slight superior endplate wedging at T2, likely chronic. Prevertebral soft tissues and predental space regions are normal. There is calcification in the anterior longitudinal ligament at C6-7. There is mild disc space narrowing at C7-T1. There is facet hypertrophy at multiple levels bilaterally. No disc extrusion or stenosis. There are vertebral artery calcifications bilaterally. There is also calcification in each carotid artery. IMPRESSION: CT head: Atrophy. 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 No acute infarct evident. No intracranial mass, hemorrhage, or extra-axial fluid collection. CT cervical spine: Mild  anterior endplate concavity at T2, likely chronic. No cervical spine fracture or spondylolisthesis. 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 No disc extrusion or stenosis. Areas of atherosclerotic vascular calcification. Electronically Signed   By: Bretta Bang III M.D.   On: 10/13/2015 13:02    Microbiology: Recent Results (from the past 240 hour(s))  MRSA PCR Screening     Status: None   Collection Time: 10/13/15  5:05 PM  Result Value Ref Range Status   MRSA by PCR NEGATIVE NEGATIVE Final    Comment:        The GeneXpert MRSA Assay (FDA approved for NASAL specimens only), is one component of a comprehensive MRSA colonization surveillance program. It is not intended to  diagnose MRSA infection nor to guide or monitor treatment for MRSA infections.      Labs: Basic Metabolic Panel:  Recent Labs Lab 10/13/15 1324 10/14/15 0346  NA 141 141  K 5.3* 4.7  CL 102 101  CO2 32 29  GLUCOSE 82 64*  BUN 34* 41*  CREATININE 0.93 1.13  CALCIUM 9.3 9.0  MG 2.2  --    Liver Function Tests:  Recent Labs Lab 10/14/15 0346  AST 13*  ALT 12*  ALKPHOS 86  BILITOT 0.6  PROT 6.7  ALBUMIN 3.7   No results for input(s): LIPASE, AMYLASE in the last 168 hours. No results for input(s): AMMONIA in the last 168 hours. CBC:  Recent Labs Lab 10/13/15 1324 10/14/15 0346  WBC 6.3 7.8  NEUTROABS 4.5  --   HGB 13.8 13.6  HCT 41.6 40.3  MCV 90.6 88.4  PLT 137* 156   Cardiac Enzymes:  Recent Labs Lab 10/13/15 1324  TROPONINI <0.03   BNP: BNP (last 3 results) No results for input(s): BNP in the last 8760 hours.  ProBNP (last 3 results) No results for input(s): PROBNP in the last 8760 hours.  CBG: No results for input(s): GLUCAP in the last 168 hours.   Signed:  CHIU, Scheryl Marten  [facility] 10/15/2015, 1:38 PM

## 2015-10-15 NOTE — Progress Notes (Signed)
Subjective: No chest pain no SOB  Objective: Vital signs in last 24 hours: Temp:  [97.8 F (36.6 C)-98.1 F (36.7 C)] 97.8 F (36.6 C) (12/23 0454) Pulse Rate:  [57-69] 69 (12/23 0454) Resp:  [16-18] 16 (12/23 0454) BP: (102-134)/(59-73) 134/73 mmHg (12/23 0454) SpO2:  [95 %-97 %] 97 % (12/23 0454) Weight change:  Last BM Date: 10/12/15 Intake/Output from previous day: -105 12/22 0701 - 12/23 0700 In: 720 [P.O.:720] Out: 825 [Urine:825] Intake/Output this shift:    PE: General:Pleasant affect, NAD Skin:Warm and dry, brisk capillary refill HEENT:normocephalic, sclera clear, mucus membranes moist Heart:S1S2 RRR without murmur, gallup, rub or click Lungs:clear without rales, rhonchi, or wheezes XLK:GMWNAbd:soft, non tender, + BS, do not palpate liver spleen or masses Ext:no lower ext edema, 2+ pedal pulses, 2+ radial pulses Neuro:alert  MAE, follows commands, + facial symmetry  tele:  SB at times, SR at times and ST to 120 at times.  Lab Results:  Recent Labs  10/13/15 1324 10/14/15 0346  WBC 6.3 7.8  HGB 13.8 13.6  HCT 41.6 40.3  PLT 137* 156   BMET  Recent Labs  10/13/15 1324 10/14/15 0346  NA 141 141  K 5.3* 4.7  CL 102 101  CO2 32 29  GLUCOSE 82 64*  BUN 34* 41*  CREATININE 0.93 1.13  CALCIUM 9.3 9.0    Recent Labs  10/13/15 1324  TROPONINI <0.03    No results found for: CHOL, HDL, LDLCALC, LDLDIRECT, TRIG, CHOLHDL No results found for: [id number]   Lab Results  Component Value Date   TSH 0.867 10/13/2015    Hepatic Function Panel  Recent Labs  10/14/15 0346  PROT 6.7  ALBUMIN 3.7  AST 13*  ALT 12*  ALKPHOS 86  BILITOT 0.6   No results for input(s): CHOL in the last 72 hours. No results for input(s): PROTIME in the last 72 hours.     Studies/Results: Ct Head Wo Contrast  10/13/2015  CLINICAL DATA:  Pain following fall EXAM: CT HEAD WITHOUT CONTRAST CT CERVICAL SPINE WITHOUT CONTRAST TECHNIQUE: Multidetector CT imaging of  the head and cervical spine was performed following the standard protocol without intravenous contrast. Multiplanar CT image reconstructions of the cervical spine were also generated. COMPARISON:  Head CT July 02, 2007 FINDINGS: CT HEAD FINDINGS Moderate diffuse atrophy is stable. There is no intracranial mass, hemorrhage, extra-axial fluid collection, or midline shift. There is evidence of a prior infarct in the posterior inferior cerebellar region on the left. There is a prior infarct in the anterior left thalamus, stable. There is an infarct involving much of the anterior limb of the left internal capsule and a portion of the anterior limb of the left external capsule, stable. There is a prior infarct in the right anterior lentiform nucleus. There is a prior small infarct in the mid right cerebellum at the posterior most aspect of the right dentate nucleus. No new gray-white compartment lesions are identified. No acute infarct evident. There is mild generalized periventricular small vessel disease. Bony calvarium appears intact. Mastoid air cells are clear. No intraorbital lesions are identified. CT CERVICAL SPINE FINDINGS There is no cervical spine fracture or spondylolisthesis. There is slight superior endplate wedging at T2, likely chronic. Prevertebral soft tissues and predental space regions are normal. There is calcification in the anterior longitudinal ligament at C6-7. There is mild disc space narrowing at C7-T1. There is facet hypertrophy at multiple levels bilaterally. No disc extrusion or  stenosis. There are vertebral artery calcifications bilaterally. There is also calcification in each carotid artery. IMPRESSION: CT head: Atrophy. 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 No acute infarct evident. No intracranial mass, hemorrhage, or extra-axial fluid collection. CT cervical spine: Mild anterior endplate concavity at T2, likely chronic. No cervical spine fracture or  spondylolisthesis. 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 No disc extrusion or stenosis. Areas of atherosclerotic vascular calcification. Electronically Signed   By: Bretta Bang III M.D.   On: 10/13/2015 13:02   Ct Cervical Spine Wo Contrast  10/13/2015  CLINICAL DATA:  Pain following fall EXAM: CT HEAD WITHOUT CONTRAST CT CERVICAL SPINE WITHOUT CONTRAST TECHNIQUE: Multidetector CT imaging of the head and cervical spine was performed following the standard protocol without intravenous contrast. Multiplanar CT image reconstructions of the cervical spine were also generated. COMPARISON:  Head CT July 02, 2007 FINDINGS: CT HEAD FINDINGS Moderate diffuse atrophy is stable. There is no intracranial mass, hemorrhage, extra-axial fluid collection, or midline shift. There is evidence of a prior infarct in the posterior inferior cerebellar region on the left. There is a prior infarct in the anterior left thalamus, stable. There is an infarct involving much of the anterior limb of the left internal capsule and a portion of the anterior limb of the left external capsule, stable. There is a prior infarct in the right anterior lentiform nucleus. There is a prior small infarct in the mid right cerebellum at the posterior most aspect of the right dentate nucleus. No new gray-white compartment lesions are identified. No acute infarct evident. There is mild generalized periventricular small vessel disease. Bony calvarium appears intact. Mastoid air cells are clear. No intraorbital lesions are identified. CT CERVICAL SPINE FINDINGS There is no cervical spine fracture or spondylolisthesis. There is slight superior endplate wedging at T2, likely chronic. Prevertebral soft tissues and predental space regions are normal. There is calcification in the anterior longitudinal ligament at C6-7. There is mild disc space narrowing at C7-T1. There is facet hypertrophy at multiple levels  bilaterally. No disc extrusion or stenosis. There are vertebral artery calcifications bilaterally. There is also calcification in each carotid artery. IMPRESSION: CT head: Atrophy. 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 No acute infarct evident. No intracranial mass, hemorrhage, or extra-axial fluid collection. CT cervical spine: Mild anterior endplate concavity at T2, likely chronic. No cervical spine fracture or spondylolisthesis. 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 No disc extrusion or stenosis. Areas of atherosclerotic vascular calcification. Electronically Signed   By: Bretta Bang III M.D.   On: 10/13/2015 13:02    Medications: I have reviewed the patient's current medications. Scheduled Meds: . aspirin EC  81 mg Oral Daily  . atorvastatin  10 mg Oral QHS  . cholecalciferol  2,000 Units Oral Daily  . divalproex  750 mg Oral QHS  . heparin  5,000 Units Subcutaneous 3 times per day  . levothyroxine  25 mcg Oral QAC breakfast  . lisinopril  2.5 mg Oral Daily  . QUEtiapine  50 mg Oral QHS  . sertraline  75 mg Oral Daily  . sodium chloride  3 mL Intravenous Q12H   Continuous Infusions:  PRN Meds:.acetaminophen **OR** acetaminophen, ondansetron **OR** ondansetron (ZOFRAN) IV  Assessment/Plan: Principal Problem:   Near syncope Active Problems:   Bradycardia   Hypertension   Dementia   Syncope, cardiogenic  1. Syncope vs. Mechanical fall -pt has no recollection  2. Marked S brady at  40 on EKG on admit over the night HR now up to 50-60 continue to monitor   -troponin negative no MI   -- off donepezil HR improved but at times still 45 and other times 120 ST.  consider outpatient 30 day event monitor if patient has further difficulty with dizziness or falls.  3. 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 There is also marked atrophy. Echocardiogram shows no embolic source.  His left  ventricular systolic function is low normal with ejection fraction 50-55%   4. Dementia--lives at [facility]  5. BP 101-94/53-57 on lisinopril 2.5  orthostatic BP without bp drop        Time spent with pt. :15 minutes. [facility] R  Nurse Practitioner Certified Pager [phone] or after 5pm and on weekends call [phone] 10/15/2015, 8:42 AM  Agree with above.  Echo was normal.  From a cardiac standpoint he could be discharged back to his home today.  Return for follow-up cardiology visit on an as-needed basis

## 2015-12-02 ENCOUNTER — Emergency Department (HOSPITAL_COMMUNITY)
Admission: EM | Admit: 2015-12-02 | Discharge: 2015-12-02 | Disposition: A | Discharge status: Home / Self Care | Payer: Medicare Other | Attending: Emergency Medicine | Admitting: Emergency Medicine

## 2015-12-02 ENCOUNTER — Encounter (HOSPITAL_COMMUNITY): Payer: Self-pay | Admitting: Emergency Medicine

## 2015-12-02 DIAGNOSIS — Y9389 Activity, other specified: Secondary | ICD-10-CM | POA: Insufficient documentation

## 2015-12-02 DIAGNOSIS — I10 Essential (primary) hypertension: Secondary | ICD-10-CM | POA: Insufficient documentation

## 2015-12-02 DIAGNOSIS — Y92129 Unspecified place in nursing home as the place of occurrence of the external cause: Secondary | ICD-10-CM | POA: Insufficient documentation

## 2015-12-02 DIAGNOSIS — Z043 Encounter for examination and observation following other accident: Secondary | ICD-10-CM | POA: Insufficient documentation

## 2015-12-02 DIAGNOSIS — Z7982 Long term (current) use of aspirin: Secondary | ICD-10-CM | POA: Diagnosis not present

## 2015-12-02 DIAGNOSIS — W1839XA Other fall on same level, initial encounter: Secondary | ICD-10-CM | POA: Diagnosis not present

## 2015-12-02 DIAGNOSIS — F0390 Unspecified dementia without behavioral disturbance: Secondary | ICD-10-CM | POA: Insufficient documentation

## 2015-12-02 DIAGNOSIS — Y998 Other external cause status: Secondary | ICD-10-CM | POA: Diagnosis not present

## 2015-12-02 DIAGNOSIS — Z79899 Other long term (current) drug therapy: Secondary | ICD-10-CM | POA: Insufficient documentation

## 2015-12-02 DIAGNOSIS — W19XXXA Unspecified fall, initial encounter: Secondary | ICD-10-CM

## 2015-12-02 LAB — URINALYSIS, ROUTINE W REFLEX MICROSCOPIC
Bilirubin Urine: NEGATIVE
Glucose, UA: NEGATIVE mg/dL
Hgb urine dipstick: NEGATIVE
Ketones, ur: NEGATIVE mg/dL
Leukocytes, UA: NEGATIVE
Nitrite: NEGATIVE
Protein, ur: NEGATIVE mg/dL
Specific Gravity, Urine: 1.014 (ref 1.005–1.030)
pH: 7 (ref 5.0–8.0)

## 2015-12-02 NOTE — ED Notes (Addendum)
Per EMS pt from [facility] sent to ED for unwitnessed fall. Pt found on back in hallway. Pt has dementia at baseline. No anticoagulants except for aspirin. Does not recall mechanism of fall. Pupils constricted.

## 2015-12-02 NOTE — ED Notes (Signed)
Bed: WA02 Expected date:  Expected time:  Means of arrival:  Comments: EMS/22M/fall

## 2015-12-02 NOTE — ED Provider Notes (Signed)
CSN: [id number]     Arrival date & time 12/02/15  0915 History   First MD Initiated Contact with Patient 12/02/15 [phone]     Chief Complaint  Patient presents with  . Fall     (Consider location/radiation/quality/duration/timing/severity/associated sxs/prior Treatment) HPI   54yM presenting after fall. Unwitnessed. Dementia. Reportedly at baseline. Can tell me he fell but not why. Has no acute complaints. Denies pain anywhere. No blood thinners aside from aspirin. No reported vomiting.   Past Medical History  Diagnosis Date  . Dementia   . Hypertension    History reviewed. No pertinent past surgical history. History reviewed. No pertinent family history. Social History  Substance Use Topics  . Smoking status: Never Smoker   . Smokeless tobacco: Never Used  . Alcohol Use: No    Review of Systems  Level 5 caveat because of dementia.    Allergies  Review of patient's allergies indicates no known allergies.  Home Medications   Prior to Admission medications   Medication Sig Start Date End Date Taking? Authorizing Provider  ammonium lactate (AMLACTIN) 12 % cream Apply 1 g topically 2 (two) times daily.   Yes Historical Provider, MD  aspirin EC 81 MG tablet Take 81 mg by mouth daily.   Yes Historical Provider, MD  atorvastatin (LIPITOR) 10 MG tablet Take 10 mg by mouth at bedtime.   Yes Historical Provider, MD  acetaminophen (TYLENOL) 500 MG tablet Take 500-1,000 mg by mouth every 4 (four) hours as needed for moderate pain (do not exceed  in 24 hours). Take  every 4 hours as needed for fever; take  every 6 hours as needed for pain. Do not exceed  in 24 hours    Historical Provider, MD  alum & mag hydroxide-simeth (GERI-LANTA) 200-200-20 MG/5ML suspension Take 30 mLs by mouth every 6 (six) hours as needed for indigestion or heartburn (no more than 4 doses in 24 hours).    Historical Provider, MD  Cholecalciferol (VITAMIN D) 2000 UNITS CAPS Take 2,000 Units by  mouth daily.    Historical Provider, MD  divalproex (DEPAKOTE ER) 250 MG 24 hr tablet Take 750 mg by mouth at bedtime.    Historical Provider, MD  guaifenesin (ROBITUSSIN) 100 MG/5ML syrup Take 200 mg by mouth every 6 (six) hours as needed for cough (no more than 4 doses in 24 hours).    Historical Provider, MD  levothyroxine (SYNTHROID, LEVOTHROID) 25 MCG tablet Take 25 mcg by mouth daily before breakfast.    Historical Provider, MD  lisinopril (PRINIVIL,ZESTRIL) 2.5 MG tablet Take 2.5 mg by mouth daily.    Historical Provider, MD  loperamide (IMODIUM) 2 MG capsule Take 2 mg by mouth every 4 (four) hours as needed for diarrhea or loose stools (no more than 8 doses in 24 hours).    Historical Provider, MD  magnesium hydroxide (MILK OF MAGNESIA) 400 MG/5ML suspension Take 30 mLs by mouth daily as needed for mild constipation or moderate constipation.    Historical Provider, MD  neomycin-bacitracin-polymyxin (NEOSPORIN) [phone] ointment Apply 1 application topically 4 (four) times daily as needed (for skin abraisions, minor tears).    Historical Provider, MD  QUEtiapine (SEROQUEL) 50 MG tablet Take 50 mg by mouth at bedtime.    Historical Provider, MD  sertraline (ZOLOFT) 50 MG tablet Take 75 mg by mouth daily.    Historical Provider, MD  triamcinolone cream (KENALOG) 0.1 % Apply 1 application topically 2 (two) times daily as needed (to rash on legs for flareups).  Historical Provider, MD  white petrolatum (VASELINE) GEL Apply 1 application topically 3 (three) times daily. apply topically to buttocks    Historical Provider, MD  Zinc Oxide (BALMEX EX) Apply 1 application topically 4 (four) times daily as needed (apply to buttocks and groin with each incontinent change).    Historical Provider, MD   BP 116/70 mmHg  Pulse 46  Temp(Src) 98.6 F (37 C) (Oral)  Resp 18  SpO2 100% Physical Exam  Constitutional: He appears well-developed and well-nourished. No distress.  HENT:  Head: Normocephalic  and atraumatic.  No appreciable scalp or facial tenderness  Eyes: Conjunctivae are normal. Right eye exhibits no discharge. Left eye exhibits no discharge.  Neck: Neck supple.  Cardiovascular: Normal rate, regular rhythm and normal heart sounds.  Exam reveals no gallop and no friction rub.   No murmur heard. Pulmonary/Chest: Effort normal and breath sounds normal. No respiratory distress.  Abdominal: Soft. He exhibits no distension. There is no tenderness.  Musculoskeletal: He exhibits no edema or tenderness.  No midline spinal tenderness. No apparent bony tenderness of extremities or pain with ROM of large joints.   Neurological: He is alert. No cranial nerve deficit. He exhibits normal muscle tone.  Pleasantly confused. Follows commands. CN 2-12 intact. Strength 5/5 b/l u/l extremities.   Skin: Skin is warm and dry.  Psychiatric: He has a normal mood and affect. His behavior is normal. Thought content normal.  Nursing note and vitals reviewed.   ED Course  Procedures (including critical care time) Labs Review Labs Reviewed  URINALYSIS, ROUTINE W REFLEX MICROSCOPIC (NOT AT [facility])    Imaging Review No results found. I have personally reviewed and evaluated these images and lab results as part of my medical decision-making.   EKG Interpretation None      MDM   Final diagnoses:  None    54yM with unwitnessed fall. Hx of dementia. Can tell me he fell but not how/why. No external signs of trauma. No tenderness on exam. He is reportedly at his baseline. Some sinus bradycardia noted. Has good blood pressure with this. Recent admission and work of this as well. UA unremarkable. Very low suspicion for serious head injury or other significant traumatic injury.     Raeford Razor, MD 12/07/15 [phone]

## 2015-12-02 NOTE — ED Notes (Signed)
Pt has urinal, attempting to provide urine specimen.

## 2016-03-12 ENCOUNTER — Encounter (HOSPITAL_COMMUNITY): Payer: Self-pay

## 2016-03-12 ENCOUNTER — Emergency Department (HOSPITAL_COMMUNITY)
Admission: EM | Admit: 2016-03-12 | Discharge: 2016-03-12 | Disposition: A | Discharge status: Home / Self Care | Payer: Medicare Other | Attending: Emergency Medicine | Admitting: Emergency Medicine

## 2016-03-12 ENCOUNTER — Emergency Department (HOSPITAL_COMMUNITY): Payer: Medicare Other

## 2016-03-12 DIAGNOSIS — W19XXXA Unspecified fall, initial encounter: Secondary | ICD-10-CM

## 2016-03-12 DIAGNOSIS — F0390 Unspecified dementia without behavioral disturbance: Secondary | ICD-10-CM | POA: Insufficient documentation

## 2016-03-12 DIAGNOSIS — Y939 Activity, unspecified: Secondary | ICD-10-CM | POA: Insufficient documentation

## 2016-03-12 DIAGNOSIS — I10 Essential (primary) hypertension: Secondary | ICD-10-CM | POA: Insufficient documentation

## 2016-03-12 DIAGNOSIS — T1490XA Injury, unspecified, initial encounter: Secondary | ICD-10-CM

## 2016-03-12 DIAGNOSIS — Y999 Unspecified external cause status: Secondary | ICD-10-CM | POA: Insufficient documentation

## 2016-03-12 DIAGNOSIS — S0990XA Unspecified injury of head, initial encounter: Secondary | ICD-10-CM | POA: Diagnosis not present

## 2016-03-12 DIAGNOSIS — Z7982 Long term (current) use of aspirin: Secondary | ICD-10-CM | POA: Insufficient documentation

## 2016-03-12 DIAGNOSIS — Y929 Unspecified place or not applicable: Secondary | ICD-10-CM | POA: Insufficient documentation

## 2016-03-12 DIAGNOSIS — W01198A Fall on same level from slipping, tripping and stumbling with subsequent striking against other object, initial encounter: Secondary | ICD-10-CM | POA: Insufficient documentation

## 2016-03-12 DIAGNOSIS — Z79899 Other long term (current) drug therapy: Secondary | ICD-10-CM | POA: Diagnosis not present

## 2016-03-12 NOTE — ED Notes (Signed)
Patient transported to X-ray 

## 2016-03-12 NOTE — ED Notes (Signed)
[facility] notified of transportation need 

## 2016-03-12 NOTE — ED Notes (Signed)
Attempted to call report to nursing facility with no answer.

## 2016-03-12 NOTE — ED Notes (Signed)
MD at bedside. 

## 2016-03-12 NOTE — ED Notes (Signed)
[facility] at bedside 

## 2016-03-12 NOTE — ED Notes (Signed)
Per EMS- Pt from [facility] at 0500 fell from a standing position and hit right side of head on trashcan. At baseline, not complaining of anything. No LOC. No skin problems noted.

## 2016-03-12 NOTE — Discharge Instructions (Signed)
You were seen and evaluated today following her fall. At this time no traumatic injuries were found. Please get up with assistance at home. Try to decrease objects on the ground and by your feet.  Fall Prevention in Hospitals, Adult As a hospital patient, your condition and the treatments you receive can increase your risk for falls. Some additional risk factors for falls in a hospital include:  Being in an unfamiliar environment.  Being on bed rest.  Your surgery.  Taking certain medicines.  Your tubing requirements, such as intravenous (IV) therapy or catheters. It is important that you learn how to decrease fall risks while at the hospital. Below are important tips that can help prevent falls. SAFETY TIPS FOR PREVENTING FALLS Talk about your risk of falling.  Ask your health care provider why you are at risk for falling. Is it your medicine, illness, tubing placement, or something else?  Make a plan with your health care provider to keep you safe from falls.  Ask your health care provider or pharmacist about side effects of your medicines. Some medicines can make you dizzy or affect your coordination. Ask for help.  Ask for help before getting out of bed. You may need to press your call button.  Ask for assistance in getting safely to the toilet.  Ask for a walker or cane to be put at your bedside. Ask that most of the side rails on your bed be placed up before your health care provider leaves the room.  Ask family or friends to sit with you.  Ask for things that are out of your reach, such as your glasses, hearing aids, telephone, bedside table, or call button. Follow these tips to avoid falling:  Stay lying or seated, rather than standing, while waiting for help.  Wear rubber-soled slippers or shoes whenever you walk in the hospital.  Avoid quick, sudden movements.  Change positions slowly.  Sit on the side of your bed before standing.  Stand up slowly and wait  before you start to walk.  Let your health care provider know if there is a spill on the floor.  Pay careful attention to the medical equipment, electrical cords, and tubes around you.  When you need help, use your call button by your bed or in the bathroom. Wait for one of your health care providers to help you.  If you feel dizzy or unsure of your footing, return to bed and wait for assistance.  Avoid being distracted by the TV, telephone, or another person in your room.  Do not lean or support yourself on rolling objects, such as IV poles or bedside tables.   This information is not intended to replace advice given to you by your health care provider. Make sure you discuss any questions you have with your health care provider.   Document Released: 10/06/2000 Document Revised: 10/30/2014 Document Reviewed: 06/16/2012 Else[employer] Interactive Patient Education [employer]2016 [employer]

## 2016-03-12 NOTE — ED Provider Notes (Signed)
CSN: [id number]     Arrival date & time 03/12/16  4098 History   First MD Initiated Contact with Patient 03/12/16 [phone]     Chief Complaint  Patient presents with  . Fall     (Consider location/radiation/quality/duration/timing/severity/associated sxs/prior Treatment) HPI Comments: 55 year old male with history of previous CVA, hypertension presents following a fall. Patient was at his nursing facility when he tripped over a trash can and fell. The patient gives the same story that was given by EMS. He is answering questions appropriately. He denies any pain at this time. Did not lose consciousness. Is not on any blood thinners.  Patient is a 55 y.o. male presenting with fall.  Fall Pertinent negatives include no chest pain, no abdominal pain, no headaches and no shortness of breath.    Past Medical History  Diagnosis Date  . Dementia   . Hypertension    History reviewed. No pertinent past surgical history. No family history on file. Social History  Substance Use Topics  . Smoking status: Never Smoker   . Smokeless tobacco: Never Used  . Alcohol Use: No    Review of Systems  Constitutional: Negative for appetite change and fatigue.  HENT: Negative for nosebleeds.   Eyes: Negative for visual disturbance.  Respiratory: Negative for shortness of breath.   Cardiovascular: Negative for chest pain.  Gastrointestinal: Negative for vomiting and abdominal pain.  Genitourinary: Negative for flank pain.  Musculoskeletal: Negative for myalgias, back pain, arthralgias, gait problem and neck pain.  Skin: Negative for rash and wound.  Neurological: Negative for dizziness, seizures, syncope, weakness, numbness and headaches.  Hematological: Does not bruise/bleed easily.      Allergies  Review of patient's allergies indicates no known allergies.  Home Medications   Prior to Admission medications   Medication Sig Start Date End Date Taking? Authorizing Provider  acetaminophen  (TYLENOL) 500 MG tablet Take 500-1,000 mg by mouth every 4 (four) hours as needed for moderate pain (do not exceed  in 24 hours). Take  every 4 hours as needed for fever; take  every 6 hours as needed for pain. Do not exceed  in 24 hours   Yes Historical Provider, MD  ammonium lactate (AMLACTIN) 12 % cream Apply 1 g topically 2 (two) times daily.   Yes Historical Provider, MD  aspirin EC 81 MG tablet Take 81 mg by mouth daily.   Yes Historical Provider, MD  atorvastatin (LIPITOR) 10 MG tablet Take 10 mg by mouth at bedtime.   Yes Historical Provider, MD  Cholecalciferol (VITAMIN D) 2000 UNITS CAPS Take 2,000 Units by mouth daily.   Yes Historical Provider, MD  divalproex (DEPAKOTE ER) 250 MG 24 hr tablet Take 750 mg by mouth at bedtime.   Yes Historical Provider, MD  guaifenesin (ROBITUSSIN) 100 MG/5ML syrup Take 200 mg by mouth every 6 (six) hours as needed for cough (no more than 4 doses in 24 hours).   Yes Historical Provider, MD  levothyroxine (SYNTHROID, LEVOTHROID) 25 MCG tablet Take 25 mcg by mouth daily before breakfast.   Yes Historical Provider, MD  lisinopril (PRINIVIL,ZESTRIL) 2.5 MG tablet Take 2.5 mg by mouth daily.   Yes Historical Provider, MD  loperamide (IMODIUM) 2 MG capsule Take 2 mg by mouth as needed for diarrhea or loose stools (no more than 8 doses in 24 hours).    Yes Historical Provider, MD  magnesium hydroxide (MILK OF MAGNESIA) 400 MG/5ML suspension Take 30 mLs by mouth daily as needed for mild constipation or  moderate constipation. Reported on 03/12/2016   Yes Historical Provider, MD  neomycin-bacitracin-polymyxin (NEOSPORIN) [phone] ointment Apply 1 application topically as needed (for skin abraisions, minor tears).    Yes Historical Provider, MD  Nutritional Supplements (NUTRITIONAL DRINK MIX PO) Take 118 mLs by mouth 3 (three) times daily. Mighty shake   Yes Historical Provider, MD  QUEtiapine (SEROQUEL) 50 MG tablet Take 50 mg by mouth at  bedtime.   Yes Historical Provider, MD  sertraline (ZOLOFT) 50 MG tablet Take 75 mg by mouth daily.   Yes Historical Provider, MD  triamcinolone cream (KENALOG) 0.1 % Apply 1 application topically 2 (two) times daily as needed (to rash on legs for flareups).   Yes Historical Provider, MD  Zinc Oxide (BALMEX EX) Apply 1 application topically as needed (apply to buttocks and groin with each incontinent change).    Yes Historical Provider, MD   BP 120/74 mmHg  Pulse 46  Temp(Src) 97.6 F (36.4 C) (Oral)  Resp 16  SpO2 100% Physical Exam  Constitutional: He is oriented to person, place, and time. He appears well-developed and well-nourished. No distress.  HENT:  Head: Normocephalic and atraumatic.  Right Ear: Tympanic membrane and external ear normal. No hemotympanum.  Left Ear: Tympanic membrane and external ear normal. No hemotympanum.  Nose: No nose lacerations or nasal septal hematoma.  Mouth/Throat: Oropharynx is clear and moist. No oropharyngeal exudate.  Eyes: Pupils are equal, round, and reactive to light.  Neck: Normal range of motion. Neck supple.  Cardiovascular: Normal rate, regular rhythm, normal heart sounds and intact distal pulses.   No murmur heard. Pulmonary/Chest: Effort normal. No respiratory distress. He has no wheezes. He has no rales.  Abdominal: Soft. He exhibits no distension. There is no tenderness.  Musculoskeletal: He exhibits no edema.  Neurological: He is alert and oriented to person, place, and time.  Patient with good strength of bilateral hand squeeze. Able to lift both lower extremities off the bed and keep them elevated against resistance. Alert and oriented.  Skin: Skin is warm and dry. No bruising, no ecchymosis, no laceration and no rash noted. He is not diaphoretic. No erythema.  Vitals reviewed.   ED Course  Procedures (including critical care time) Labs Review Labs Reviewed - No data to display  Imaging Review Dg Chest 2 View  03/12/2016   CLINICAL DATA:  Trauma EXAM: CHEST  2 VIEW COMPARISON:  03/29/2005 chest radiograph. FINDINGS: Stable cardiomediastinal silhouette with normal heart size. No pneumothorax. No pleural effusion. Lungs appear clear, with no acute consolidative airspace disease and no pulmonary edema. No displaced fractures. IMPRESSION: No active cardiopulmonary disease. Electronically Signed   By: Delbert PhenixJason A Poff M.D.   On: 03/12/2016 09:09   Ct Head Wo Contrast  03/12/2016  CLINICAL DATA:  Post fall hitting right side of head on trash can. No loss of consciousness. EXAM: CT HEAD WITHOUT CONTRAST TECHNIQUE: Contiguous axial images were obtained from the base of the skull through the vertex without intravenous contrast. COMPARISON:  10/13/2015; 07/02/2007 FINDINGS: Brain: Similar findings of advanced atrophy with centralized volume loss and commensurate ex vacuo dilatation of the ventricular system. Old lacunar infarcts within the bilateral basal ganglia (representative image 12, series 2) and right cerebellar hemisphere (image 7, series 2). Old infarct within the left cerebellar hemisphere (image 4, series 2). Scattered periventricular hypodensities compatible microvascular ischemic disease. Given background parenchymal abnormalities, there is no CT evidence of superimposed acute large territory infarct. No intraparenchymal or extra-axial mass or hemorrhage. Unchanged size a  configuration of the ventricles and basilar cisterns. No midline shift. Vascular: Intracranial atherosclerosis Skull: Negative for fracture or focal lesion. Sinuses/Orbits: Limited visualization the paranasal sinuses and mastoid air cells are normal. No air-fluid levels. Other: Regional soft tissues appear normal. No radiopaque foreign body. IMPRESSION: Similar findings of atrophy, microvascular ischemic disease and old left cerebellar infarct without acute intracranial process. Electronically Signed   By: Simonne Come M.D.   On: 03/12/2016 08:36   Dg Hips Bilat  With Pelvis 3-4 Views  03/12/2016  CLINICAL DATA:  Trauma EXAM: DG HIP (WITH OR WITHOUT PELVIS) 3-4V BILAT COMPARISON:  None. FINDINGS: No pelvic fracture or diastasis. Mild lower lumbar spine degenerative changes. No fracture or dislocation in either hip. Minimal degenerative change in the weight-bearing portion of both hip joints. No suspicious focal osseous lesions. IMPRESSION: Negative. Electronically Signed   By: Delbert Phenix M.D.   On: 03/12/2016 09:11   I have personally reviewed and evaluated these images and lab results as part of my medical decision-making.   EKG Interpretation None      MDM  Patient seen and evaluated in stable condition. Patient answering questions appropriately and denies any pain. Give the same history that was given on transport. No sign of trauma on evaluation. X-rays unremarkable. CT head unremarkable. Patient was discharged home in stable condition with instruction on fall prevention. Final diagnoses:  Trauma  Fall, initial encounter    1. Nathaneil Canary Dolores Hoose, MD 03/12/16 [phone]

## 2016-06-14 ENCOUNTER — Encounter (HOSPITAL_COMMUNITY): Payer: Self-pay | Admitting: Emergency Medicine

## 2016-06-14 ENCOUNTER — Emergency Department (HOSPITAL_COMMUNITY)
Admission: EM | Admit: 2016-06-14 | Discharge: 2016-06-14 | Disposition: A | Discharge status: Home / Self Care | Payer: Medicare Other | Attending: Emergency Medicine | Admitting: Emergency Medicine

## 2016-06-14 DIAGNOSIS — Z79899 Other long term (current) drug therapy: Secondary | ICD-10-CM | POA: Insufficient documentation

## 2016-06-14 DIAGNOSIS — Z7982 Long term (current) use of aspirin: Secondary | ICD-10-CM | POA: Diagnosis not present

## 2016-06-14 DIAGNOSIS — I10 Essential (primary) hypertension: Secondary | ICD-10-CM | POA: Insufficient documentation

## 2016-06-14 DIAGNOSIS — Z043 Encounter for examination and observation following other accident: Secondary | ICD-10-CM | POA: Insufficient documentation

## 2016-06-14 DIAGNOSIS — W07XXXA Fall from chair, initial encounter: Secondary | ICD-10-CM | POA: Diagnosis not present

## 2016-06-14 DIAGNOSIS — Y92002 Bathroom of unspecified non-institutional (private) residence single-family (private) house as the place of occurrence of the external cause: Secondary | ICD-10-CM | POA: Insufficient documentation

## 2016-06-14 DIAGNOSIS — Y9389 Activity, other specified: Secondary | ICD-10-CM | POA: Insufficient documentation

## 2016-06-14 DIAGNOSIS — W19XXXA Unspecified fall, initial encounter: Secondary | ICD-10-CM

## 2016-06-14 DIAGNOSIS — Y999 Unspecified external cause status: Secondary | ICD-10-CM | POA: Insufficient documentation

## 2016-06-14 HISTORY — DX: Disorder of thyroid, unspecified: E07.9

## 2016-06-14 NOTE — ED Triage Notes (Signed)
Per EMS- [facility] [address] Patient normally [place] a wheelchai. Patient was scooting his legs and had an unwitnessed fall in the bathroom. Patient denies any injuries, pain, or LOC. Patient does have a history of dementia.

## 2016-06-14 NOTE — ED Notes (Signed)
[facility] called for transportation back to [facility]. 

## 2016-06-14 NOTE — ED Notes (Signed)
Bed: ZO10WA05 Expected date:  Expected time:  Means of arrival:  Comments: EMS- 55yo M, fall/no complaints/Hx of dementia

## 2016-06-14 NOTE — Discharge Instructions (Signed)
You do not seem to have injured anything from the fall.

## 2016-06-14 NOTE — ED Provider Notes (Signed)
[facility]-EMERGENCY DEPT Provider Note   CSN: [id number] Arrival date & time: 06/14/16  0741     History   Chief Complaint Chief Complaint  Patient presents with  . Fall   Level V caveat due to dementia. HPI Donald Montoya is a 55 y.o. male.  HPI Patient presents from the nursing home with a history of dementia. Reportedly had a fall. Reportedly was in a wheelchair and fell forward. Patient states he did fall. Without complaints right now. No head or neck pain. No chest or abdominal pain. States he has not had breakfast and is hungry. He is not on anticoagulation. Does have history of dementia. He is wearing sunglasses.   Past Medical History:  Diagnosis Date  . Dementia   . Hypertension   . Thyroid disease     Patient Active Problem List   Diagnosis Date Noted  . Near syncope 10/13/2015  . Bradycardia 10/13/2015  . Hypertension 10/13/2015  . Dementia 10/13/2015  . Syncope, cardiogenic 10/13/2015    History reviewed. No pertinent surgical history.     Home Medications    Prior to Admission medications   Medication Sig Start Date End Date Taking? Authorizing Provider  acetaminophen (TYLENOL) 500 MG tablet Take 500-1,000 mg by mouth every 4 (four) hours as needed for moderate pain (do not exceed 2000mg  in 24 hours). Take 500mg  every 4 hours as needed for fever; take 1000mg  every 6 hours as needed for pain. Do not exceed 2000mg  in 24 hours    Historical Provider, MD  ammonium lactate (AMLACTIN) 12 % cream Apply 1 g topically 2 (two) times daily.    Historical Provider, MD  aspirin EC 81 MG tablet Take 81 mg by mouth daily.    Historical Provider, MD  atorvastatin (LIPITOR) 10 MG tablet Take 10 mg by mouth at bedtime.    Historical Provider, MD  Cholecalciferol (VITAMIN D) 2000 UNITS CAPS Take 2,000 Units by mouth daily.    Historical Provider, MD  divalproex (DEPAKOTE ER) 250 MG 24 hr tablet Take 750 mg by mouth at bedtime.    Historical Provider, MD  guaifenesin  (ROBITUSSIN) 100 MG/5ML syrup Take 200 mg by mouth every 6 (six) hours as needed for cough (no more than 4 doses in 24 hours).    Historical Provider, MD  levothyroxine (SYNTHROID, LEVOTHROID) 25 MCG tablet Take 25 mcg by mouth daily before breakfast.    Historical Provider, MD  lisinopril (PRINIVIL,ZESTRIL) 2.5 MG tablet Take 2.5 mg by mouth daily.    Historical Provider, MD  loperamide (IMODIUM) 2 MG capsule Take 2 mg by mouth as needed for diarrhea or loose stools (no more than 8 doses in 24 hours).     Historical Provider, MD  magnesium hydroxide (MILK OF MAGNESIA) 400 MG/5ML suspension Take 30 mLs by mouth daily as needed for mild constipation or moderate constipation. Reported on 03/12/2016    Historical Provider, MD  neomycin-bacitracin-polymyxin (NEOSPORIN) [phone] ointment Apply 1 application topically as needed (for skin abraisions, minor tears).     Historical Provider, MD  Nutritional Supplements (NUTRITIONAL DRINK MIX PO) Take 118 mLs by mouth 3 (three) times daily. Mighty shake    Historical Provider, MD  QUEtiapine (SEROQUEL) 50 MG tablet Take 50 mg by mouth at bedtime.    Historical Provider, MD  sertraline (ZOLOFT) 50 MG tablet Take 75 mg by mouth daily.    Historical Provider, MD  triamcinolone cream (KENALOG) 0.1 % Apply 1 application topically 2 (two) times daily as needed (  to rash on legs for flareups).    Historical Provider, MD  Zinc Oxide (BALMEX EX) Apply 1 application topically as needed (apply to buttocks and groin with each incontinent change).     Historical Provider, MD    Family History Family History  Problem Relation Age of Onset  . Family history unknown: Yes    Social History Social History  Substance Use Topics  . Smoking status: Never Smoker  . Smokeless tobacco: Never Used  . Alcohol use No     Allergies   Review of patient's allergies indicates no known allergies.   Review of Systems Review of Systems  Unable to perform ROS: Dementia      Physical Exam Updated Vital Signs BP 112/75 (BP Location: Left Arm)   Pulse (!) 51   Temp 97.9 F (36.6 C) (Oral)   Resp 18   SpO2 97%   Physical Exam  Constitutional: He appears well-developed and well-nourished.  HENT:  Head: Atraumatic.  Eyes: EOM are normal.  Neck: Normal range of motion. Neck supple.  Cardiovascular: Normal rate.   Pulmonary/Chest: Effort normal.  Abdominal: Soft. There is no tenderness.  Musculoskeletal: He exhibits no tenderness.  Neurological: He is alert.  Skin: Skin is warm.     ED Treatments / Results  Labs (all labs ordered are listed, but only abnormal results are displayed) Labs Reviewed - No data to display  EKG  EKG Interpretation None       Radiology No results found.  Procedures Procedures (including critical care time)  Medications Ordered in ED Medications - No data to display   Initial Impression / Assessment and Plan / ED Course  I have reviewed the triage vital signs and the nursing notes.  Pertinent labs & imaging results that were available during my care of the patient were reviewed by me and considered in my medical decision making (see chart for details).  Clinical Course    Patient with fall. No apparent trauma. Does have dementia but without complaints. Discharge  Final Clinical Impressions(s) / ED Diagnoses   Final diagnoses:  Fall, initial encounter    New Prescriptions New Prescriptions   No medications on file     Benjiman CoreNathan Laia Wiley, MD 06/14/16 [phone]

## 2016-07-27 ENCOUNTER — Emergency Department (HOSPITAL_COMMUNITY)
Admission: EM | Admit: 2016-07-27 | Discharge: 2016-07-27 | Disposition: A | Discharge status: Home / Self Care | Payer: Medicare Other | Attending: Emergency Medicine | Admitting: Emergency Medicine

## 2016-07-27 ENCOUNTER — Encounter (HOSPITAL_COMMUNITY): Payer: Self-pay | Admitting: *Deleted

## 2016-07-27 DIAGNOSIS — Z79899 Other long term (current) drug therapy: Secondary | ICD-10-CM | POA: Diagnosis not present

## 2016-07-27 DIAGNOSIS — Y929 Unspecified place or not applicable: Secondary | ICD-10-CM | POA: Insufficient documentation

## 2016-07-27 DIAGNOSIS — W19XXXA Unspecified fall, initial encounter: Secondary | ICD-10-CM

## 2016-07-27 DIAGNOSIS — W06XXXA Fall from bed, initial encounter: Secondary | ICD-10-CM | POA: Insufficient documentation

## 2016-07-27 DIAGNOSIS — Z7982 Long term (current) use of aspirin: Secondary | ICD-10-CM | POA: Insufficient documentation

## 2016-07-27 DIAGNOSIS — I10 Essential (primary) hypertension: Secondary | ICD-10-CM | POA: Diagnosis not present

## 2016-07-27 DIAGNOSIS — Y939 Activity, unspecified: Secondary | ICD-10-CM | POA: Insufficient documentation

## 2016-07-27 DIAGNOSIS — F0390 Unspecified dementia without behavioral disturbance: Secondary | ICD-10-CM | POA: Insufficient documentation

## 2016-07-27 DIAGNOSIS — Y999 Unspecified external cause status: Secondary | ICD-10-CM | POA: Diagnosis not present

## 2016-07-27 NOTE — Discharge Instructions (Signed)
Donald Montoya was seen in the ER after he fell from the bed. Though he has dementia/confusion - he is able to tell us his name, where he is at - and doesn't seem altered at the moment. He also responds to noxious stimuli, and reports that he has no headache, neck pain, numbness, tingling, hip pain, back pain.  We feel comfortably clinically clearing Donald Montoya from trauma side - as we dont see any clear evidence of severe bruise to the face/scalp and the neck range of motion is normal without any focal pain and the spine and pelvis are stable as well.

## 2016-07-27 NOTE — ED Provider Notes (Signed)
[facility]-EMERGENCY DEPT Provider Note   CSN: [id number] Arrival date & time: 07/27/16  [id number]  By signing my name below, I, Alyssa GroveMartin Green, attest that this documentation has been prepared under the direction and in the presence of Derwood KaplanAnkit Izea Livolsi, MD. Electronically Signed: Alyssa GroveMartin Green, ED Scribe. 07/27/16. 4:10 AM.   History   Chief Complaint Chief Complaint  Patient presents with  . Fall   LEVEL 5 CAVEAT: HPI and ROS limited due to Dementia  The history is provided by the patient. No language interpreter was used.   HPI Comments: Donald Montoya is a 55 y.o. male with PMHx of Dementia who presents to the Emergency Department complaining of a fall earlier tonight. Pt had an unwitnessed fall out of his bed tonight. Pt has dementia - but he is following commands, alert and oriented to self and place. He denies any headache, loss of consciousness. He denies neck pain, chest pain, pelvic pain, back pain.   Past Medical History:  Diagnosis Date  . Dementia   . Hypertension   . Thyroid disease     Patient Active Problem List   Diagnosis Date Noted  . Near syncope 10/13/2015  . Bradycardia 10/13/2015  . Hypertension 10/13/2015  . Dementia 10/13/2015  . Syncope, cardiogenic 10/13/2015    History reviewed. No pertinent surgical history.     Home Medications    Prior to Admission medications   Medication Sig Start Date End Date Taking? Authorizing Provider  acetaminophen (TYLENOL) 500 MG tablet Take 500-1,000 mg by mouth every 4 (four) hours as needed for moderate pain (do not exceed 2000mg  in 24 hours). Take 500mg  every 4 hours as needed for fever; take 1000mg  every 6 hours as needed for pain. Do not exceed 2000mg  in 24 hours    Historical Provider, MD  ammonium lactate (AMLACTIN) 12 % cream Apply 1 g topically 2 (two) times daily.    Historical Provider, MD  aspirin EC 81 MG tablet Take 81 mg by mouth daily.    Historical Provider, MD  atorvastatin (LIPITOR) 10 MG tablet Take 10  mg by mouth at bedtime.    Historical Provider, MD  Cholecalciferol (VITAMIN D) 2000 UNITS CAPS Take 2,000 Units by mouth daily.    Historical Provider, MD  divalproex (DEPAKOTE ER) 250 MG 24 hr tablet Take 750 mg by mouth at bedtime.    Historical Provider, MD  guaifenesin (ROBITUSSIN) 100 MG/5ML syrup Take 200 mg by mouth every 6 (six) hours as needed for cough (no more than 4 doses in 24 hours).    Historical Provider, MD  levothyroxine (SYNTHROID, LEVOTHROID) 25 MCG tablet Take 25 mcg by mouth daily before breakfast.    Historical Provider, MD  lisinopril (PRINIVIL,ZESTRIL) 2.5 MG tablet Take 2.5 mg by mouth daily.    Historical Provider, MD  loperamide (IMODIUM) 2 MG capsule Take 2 mg by mouth as needed for diarrhea or loose stools (no more than 8 doses in 24 hours).     Historical Provider, MD  magnesium hydroxide (MILK OF MAGNESIA) 400 MG/5ML suspension Take 30 mLs by mouth daily as needed for mild constipation or moderate constipation. Reported on 03/12/2016    Historical Provider, MD  neomycin-bacitracin-polymyxin (NEOSPORIN) [phone] ointment Apply 1 application topically as needed (for skin abraisions, minor tears).     Historical Provider, MD  Nutritional Supplements (NUTRITIONAL DRINK MIX PO) Take 118 mLs by mouth 3 (three) times daily. Mighty shake    Historical Provider, MD  QUEtiapine (SEROQUEL) 50 MG tablet Take  50 mg by mouth at bedtime.    Historical Provider, MD  sertraline (ZOLOFT) 50 MG tablet Take 75 mg by mouth daily.    Historical Provider, MD  triamcinolone cream (KENALOG) 0.1 % Apply 1 application topically 2 (two) times daily as needed (to rash on legs for flareups).    Historical Provider, MD  Zinc Oxide (BALMEX EX) Apply 1 application topically as needed (apply to buttocks and groin with each incontinent change).     Historical Provider, MD    Family History Family History  Problem Relation Age of Onset  . Family history unknown: Yes    Social History Social  History  Substance Use Topics  . Smoking status: Never Smoker  . Smokeless tobacco: Never Used  . Alcohol use No     Allergies   Review of patient's allergies indicates no known allergies.   Review of Systems Review of Systems  Unable to perform ROS: Dementia     Physical Exam Updated Vital Signs BP 117/70 (BP Location: Left Arm)   Pulse (!) 52   Temp 97.3 F (36.3 C) (Oral)   SpO2 98%   Physical Exam  Constitutional: He appears well-developed and well-nourished.  HENT:  Head: Normocephalic.  Eyes: EOM are normal.  Neck: Normal range of motion.  No midline c-spine tenderness, pt able to turn head to 45 degrees bilaterally without any pain and able to flex neck to the chest and extend without any pain or neurologic symptoms.   Pulmonary/Chest: Effort normal.  Abdominal: He exhibits no distension.  Musculoskeletal: Normal range of motion.  Head to toe evaluation shows no hematoma, bleeding of the scalp, no facial abrasions, step offs, crepitus, no tenderness to palpation of the bilateral upper and lower extremities, no gross deformities, no chest tenderness, no pelvic pain, no spine tenderness    Neurological: He is alert.  Oriented to self/location  Psychiatric: He has a normal mood and affect.  Nursing note and vitals reviewed.    ED Treatments / Results  DIAGNOSTIC STUDIES: Oxygen Saturation is 98% on RA, normal by my interpretation.    Labs (all labs ordered are listed, but only abnormal results are displayed) Labs Reviewed - No data to display  EKG  EKG Interpretation None       Radiology No results found.  Procedures Procedures (including critical care time)  Medications Ordered in ED Medications - No data to display   Initial Impression / Assessment and Plan / ED Course  I have reviewed the triage vital signs and the nursing notes.  Pertinent labs & imaging results that were available during my care of the patient were reviewed by me and  considered in my medical decision making (see chart for details).  Clinical Course   I personally performed the services described in this documentation, which was scribed in my presence. The recorded information has been reviewed and is accurate.  Pt comes in with fall. He has no bruising to the head/scalp/face. He denies striking his head or having LOC or headaches. PT has no neck pain, no numbness, tingling. Pt is moving all 4 extremities and has no midline spine tenderness. I feel comfortable clinically clearing the patient's brain and cspine and don't see indication for any imaging. Will d/c.  Final Clinical Impressions(s) / ED Diagnoses   Final diagnoses:  Fall, initial encounter    New Prescriptions New Prescriptions   No medications on file     Derwood Kaplan, MD 07/27/16 [phone]

## 2016-07-27 NOTE — ED Notes (Signed)
Patient is alert and oriented x3.  He was given DC instructions and follow up visit instructions.  Patient gave verbal understanding.  He was DC ambulatory under his own power to home.  V/S stable.  He was not showing any signs of distress on DC 

## 2016-07-27 NOTE — ED Notes (Signed)
Bed: [id number] Expected date:  Expected time:  Means of arrival:  Comments: Unwitnessed fall

## 2016-07-27 NOTE — ED Triage Notes (Signed)
Patient is alert and oriented to baseline. [facility]  Patient denies any pain.  No bleeding or deformity noted.

## 2017-05-23 DIAGNOSIS — N179 Acute kidney failure, unspecified: Secondary | ICD-10-CM

## 2017-05-23 HISTORY — DX: Acute kidney failure, unspecified: N17.9

## 2017-06-18 ENCOUNTER — Encounter (HOSPITAL_COMMUNITY): Payer: Self-pay | Admitting: Emergency Medicine

## 2017-06-18 ENCOUNTER — Inpatient Hospital Stay (HOSPITAL_COMMUNITY): Payer: Medicare Other

## 2017-06-18 ENCOUNTER — Emergency Department (HOSPITAL_COMMUNITY): Payer: Medicare Other

## 2017-06-18 ENCOUNTER — Inpatient Hospital Stay (HOSPITAL_COMMUNITY)
Admission: EM | Admit: 2017-06-18 | Discharge: 2017-06-22 | DRG: 071 | Disposition: A | Discharge status: Home / Self Care | Payer: Medicare Other | Attending: Internal Medicine | Admitting: Internal Medicine

## 2017-06-18 DIAGNOSIS — R6510 Systemic inflammatory response syndrome (SIRS) of non-infectious origin without acute organ dysfunction: Secondary | ICD-10-CM | POA: Diagnosis present

## 2017-06-18 DIAGNOSIS — R9431 Abnormal electrocardiogram [ECG] [EKG]: Secondary | ICD-10-CM | POA: Diagnosis not present

## 2017-06-18 DIAGNOSIS — N179 Acute kidney failure, unspecified: Secondary | ICD-10-CM | POA: Diagnosis present

## 2017-06-18 DIAGNOSIS — E860 Dehydration: Secondary | ICD-10-CM | POA: Diagnosis present

## 2017-06-18 DIAGNOSIS — R739 Hyperglycemia, unspecified: Secondary | ICD-10-CM | POA: Diagnosis present

## 2017-06-18 DIAGNOSIS — R000 Tachycardia, unspecified: Secondary | ICD-10-CM | POA: Diagnosis not present

## 2017-06-18 DIAGNOSIS — Z7982 Long term (current) use of aspirin: Secondary | ICD-10-CM | POA: Diagnosis not present

## 2017-06-18 DIAGNOSIS — I10 Essential (primary) hypertension: Secondary | ICD-10-CM | POA: Diagnosis present

## 2017-06-18 DIAGNOSIS — E870 Hyperosmolality and hypernatremia: Secondary | ICD-10-CM | POA: Diagnosis present

## 2017-06-18 DIAGNOSIS — E785 Hyperlipidemia, unspecified: Secondary | ICD-10-CM | POA: Diagnosis present

## 2017-06-18 DIAGNOSIS — I4581 Long QT syndrome: Secondary | ICD-10-CM | POA: Diagnosis present

## 2017-06-18 DIAGNOSIS — Z8673 Personal history of transient ischemic attack (TIA), and cerebral infarction without residual deficits: Secondary | ICD-10-CM

## 2017-06-18 DIAGNOSIS — G9341 Metabolic encephalopathy: Secondary | ICD-10-CM | POA: Diagnosis present

## 2017-06-18 DIAGNOSIS — E039 Hypothyroidism, unspecified: Secondary | ICD-10-CM | POA: Diagnosis present

## 2017-06-18 DIAGNOSIS — E079 Disorder of thyroid, unspecified: Secondary | ICD-10-CM | POA: Diagnosis present

## 2017-06-18 DIAGNOSIS — F0390 Unspecified dementia without behavioral disturbance: Secondary | ICD-10-CM | POA: Diagnosis present

## 2017-06-18 DIAGNOSIS — A419 Sepsis, unspecified organism: Secondary | ICD-10-CM | POA: Diagnosis present

## 2017-06-18 DIAGNOSIS — R404 Transient alteration of awareness: Secondary | ICD-10-CM

## 2017-06-18 DIAGNOSIS — Z23 Encounter for immunization: Secondary | ICD-10-CM

## 2017-06-18 DIAGNOSIS — Z79899 Other long term (current) drug therapy: Secondary | ICD-10-CM

## 2017-06-18 DIAGNOSIS — F0280 Dementia in other diseases classified elsewhere without behavioral disturbance: Secondary | ICD-10-CM | POA: Diagnosis not present

## 2017-06-18 DIAGNOSIS — R509 Fever, unspecified: Secondary | ICD-10-CM

## 2017-06-18 HISTORY — DX: Acute kidney failure, unspecified: N17.9

## 2017-06-18 LAB — COMPREHENSIVE METABOLIC PANEL
ALK PHOS: 55 U/L (ref 38–126)
ALT: 20 U/L (ref 17–63)
AST: 45 U/L — ABNORMAL HIGH (ref 15–41)
Albumin: 3.2 g/dL — ABNORMAL LOW (ref 3.5–5.0)
Anion gap: 13 (ref 5–15)
BUN: 36 mg/dL — ABNORMAL HIGH (ref 6–20)
CALCIUM: 9.3 mg/dL (ref 8.9–10.3)
CO2: 26 mmol/L (ref 22–32)
CREATININE: 1.51 mg/dL — AB (ref 0.61–1.24)
Chloride: 118 mmol/L — ABNORMAL HIGH (ref 101–111)
GFR, EST AFRICAN AMERICAN: 57 mL/min — AB (ref >60)
GFR, EST NON AFRICAN AMERICAN: 50 mL/min — AB (ref >60)
Glucose, Bld: 214 mg/dL — ABNORMAL HIGH (ref 65–99)
Potassium: 4.3 mmol/L (ref 3.5–5.1)
Sodium: 157 mmol/L — ABNORMAL HIGH (ref 135–145)
TOTAL PROTEIN: 6.1 g/dL — AB (ref 6.5–8.1)
Total Bilirubin: 1.1 mg/dL (ref 0.3–1.2)

## 2017-06-18 LAB — I-STAT CG4 LACTIC ACID, ED: LACTIC ACID, VENOUS: 2.71 mmol/L — AB (ref 0.5–1.9)

## 2017-06-18 LAB — OSMOLALITY, URINE: Osmolality, Ur: 963 mOsm/kg — ABNORMAL HIGH (ref 300–900)

## 2017-06-18 LAB — CBC
HCT: 49.6 % (ref 39.0–52.0)
HCT: 45.2 % (ref 39.0–52.0)
HEMOGLOBIN: 15.6 g/dL (ref 13.0–17.0)
Hemoglobin: 14.1 g/dL (ref 13.0–17.0)
MCH: 29.1 pg (ref 26.0–34.0)
MCH: 29 pg (ref 26.0–34.0)
MCHC: 31.5 g/dL (ref 30.0–36.0)
MCHC: 31.2 g/dL (ref 30.0–36.0)
MCV: 92.5 fL (ref 78.0–100.0)
MCV: 92.8 fL (ref 78.0–100.0)
PLATELETS: 198 10*3/uL (ref 150–400)
PLATELETS: 167 10*3/uL (ref 150–400)
RBC: 5.36 MIL/uL (ref 4.22–5.81)
RBC: 4.87 MIL/uL (ref 4.22–5.81)
RDW: 15.2 % (ref 11.5–15.5)
RDW: 15.2 % (ref 11.5–15.5)
WBC: 11 10*3/uL — ABNORMAL HIGH (ref 4.0–10.5)
WBC: 11.2 10*3/uL — AB (ref 4.0–10.5)

## 2017-06-18 LAB — PROTIME-INR
INR: 1.15
PROTHROMBIN TIME: 14.8 s (ref 11.4–15.2)

## 2017-06-18 LAB — URINALYSIS, ROUTINE W REFLEX MICROSCOPIC
BILIRUBIN URINE: NEGATIVE
Glucose, UA: NEGATIVE mg/dL
Hgb urine dipstick: NEGATIVE
Ketones, ur: NEGATIVE mg/dL
Leukocytes, UA: NEGATIVE
NITRITE: NEGATIVE
PH: 5 (ref 5.0–8.0)
Protein, ur: NEGATIVE mg/dL
SPECIFIC GRAVITY, URINE: 1.024 (ref 1.005–1.030)

## 2017-06-18 LAB — PROCALCITONIN

## 2017-06-18 LAB — APTT: aPTT: 27 seconds (ref 24–36)

## 2017-06-18 LAB — LACTIC ACID, PLASMA
Lactic Acid, Venous: 0.9 mmol/L (ref 0.5–1.9)
Lactic Acid, Venous: 0.9 mmol/L (ref 0.5–1.9)

## 2017-06-18 MED ORDER — ONDANSETRON HCL 4 MG/2ML IJ SOLN: 4.0000 mg | Freq: Four times a day (QID) | INTRAMUSCULAR | Status: DC | PRN

## 2017-06-18 MED ORDER — METOPROLOL SUCCINATE ER 25 MG PO TB24: 25.0000 mg | ORAL_TABLET | Freq: Two times a day (BID) | ORAL | Status: DC

## 2017-06-18 MED ORDER — DIVALPROEX SODIUM ER 500 MG PO TB24
750.0000 mg | ORAL_TABLET | Freq: Every day | ORAL | Status: DC
  Filled 2017-06-18: qty 1

## 2017-06-18 MED ORDER — LOPERAMIDE HCL 2 MG PO CAPS: 2.0000 mg | ORAL_CAPSULE | ORAL | Status: DC | PRN

## 2017-06-18 MED ORDER — VANCOMYCIN HCL 10 G IV SOLR
1500.0000 mg | Freq: Once | INTRAVENOUS | Status: DC
  Filled 2017-06-18 (×2): qty 1500

## 2017-06-18 MED ORDER — MAGNESIUM HYDROXIDE 400 MG/5ML PO SUSP: 30.0000 mL | Freq: Every day | ORAL | Status: DC | PRN

## 2017-06-18 MED ORDER — VANCOMYCIN HCL 500 MG IV SOLR
500.0000 mg | Freq: Two times a day (BID) | INTRAVENOUS | Status: DC
  Filled 2017-06-18: qty 500

## 2017-06-18 MED ORDER — SODIUM CHLORIDE 0.9 % IV BOLUS (SEPSIS)
1000.0000 mL | Freq: Once | INTRAVENOUS | Status: AC
  Administered 2017-06-18: 1000 mL via INTRAVENOUS

## 2017-06-18 MED ORDER — SODIUM CHLORIDE 0.45 % IV SOLN
INTRAVENOUS | Status: DC
  Administered 2017-06-18 – 2017-06-19 (×3): via INTRAVENOUS

## 2017-06-18 MED ORDER — GUAIFENESIN 100 MG/5ML PO SYRP
200.0000 mg | ORAL_SOLUTION | Freq: Four times a day (QID) | ORAL | Status: DC | PRN
  Filled 2017-06-18: qty 10

## 2017-06-18 MED ORDER — ONDANSETRON HCL 4 MG PO TABS: 4.0000 mg | ORAL_TABLET | Freq: Four times a day (QID) | ORAL | Status: DC | PRN

## 2017-06-18 MED ORDER — NUTRITIONAL DRINK MIX PO POWD: 1.0000 | Freq: Two times a day (BID) | ORAL | Status: DC

## 2017-06-18 MED ORDER — ATORVASTATIN CALCIUM 10 MG PO TABS: 10.0000 mg | ORAL_TABLET | Freq: Every day | ORAL | Status: DC

## 2017-06-18 MED ORDER — ALUM & MAG HYDROXIDE-SIMETH 200-200-20 MG/5ML PO SUSP: 30.0000 mL | Freq: Four times a day (QID) | ORAL | Status: DC | PRN

## 2017-06-18 MED ORDER — ASPIRIN EC 81 MG PO TBEC
81.0000 mg | DELAYED_RELEASE_TABLET | Freq: Every day | ORAL | Status: DC
  Administered 2017-06-19 – 2017-06-22 (×4): 81 mg via ORAL
  Filled 2017-06-18 (×4): qty 1

## 2017-06-18 MED ORDER — ACETAMINOPHEN 650 MG RE SUPP: 650.0000 mg | Freq: Four times a day (QID) | RECTAL | Status: DC | PRN

## 2017-06-18 MED ORDER — DIVALPROEX SODIUM ER 250 MG PO TB24
750.0000 mg | ORAL_TABLET | Freq: Every day | ORAL | Status: DC
  Administered 2017-06-19 – 2017-06-21 (×3): 750 mg via ORAL
  Filled 2017-06-18 (×3): qty 1

## 2017-06-18 MED ORDER — SODIUM CHLORIDE 0.45 % IV SOLN
INTRAVENOUS | Status: DC
Start: 2017-06-18 — End: 2017-06-19

## 2017-06-18 MED ORDER — PIPERACILLIN-TAZOBACTAM 3.375 G IVPB 30 MIN
3.3750 g | Freq: Once | INTRAVENOUS | Status: AC
  Administered 2017-06-18: 3.375 g via INTRAVENOUS

## 2017-06-18 MED ORDER — ATORVASTATIN CALCIUM 10 MG PO TABS
10.0000 mg | ORAL_TABLET | Freq: Every day | ORAL | Status: DC
  Administered 2017-06-19 – 2017-06-21 (×3): 10 mg via ORAL
  Filled 2017-06-18 (×3): qty 1

## 2017-06-18 MED ORDER — VANCOMYCIN HCL 10 G IV SOLR
1500.0000 mg | Freq: Once | INTRAVENOUS | Status: AC
  Administered 2017-06-19: 1500 mg via INTRAVENOUS
  Filled 2017-06-18: qty 1500

## 2017-06-18 MED ORDER — ACETAMINOPHEN 325 MG PO TABS: 650.0000 mg | ORAL_TABLET | Freq: Four times a day (QID) | ORAL | Status: DC | PRN

## 2017-06-18 MED ORDER — FREE WATER: 250.0000 mL | Freq: Four times a day (QID) | Status: DC

## 2017-06-18 MED ORDER — DEXTROSE 5 % IV SOLN
750.0000 mg | Freq: Once | INTRAVENOUS | Status: AC
  Administered 2017-06-18: 750 mg via INTRAVENOUS
  Filled 2017-06-18: qty 7.5

## 2017-06-18 MED ORDER — PIPERACILLIN-TAZOBACTAM 3.375 G IVPB
3.3750 g | Freq: Three times a day (TID) | INTRAVENOUS | Status: DC
  Administered 2017-06-19 – 2017-06-21 (×7): 3.375 g via INTRAVENOUS
  Filled 2017-06-18 (×9): qty 50

## 2017-06-18 MED ORDER — TRIPLE ANTIBIOTIC 3.5-400-5000 EX OINT
1.0000 "application " | TOPICAL_OINTMENT | CUTANEOUS | Status: DC | PRN
  Filled 2017-06-18: qty 1

## 2017-06-18 MED ORDER — VITAMIN D 1000 UNITS PO TABS
2000.0000 [IU] | ORAL_TABLET | Freq: Every day | ORAL | Status: DC
  Administered 2017-06-19 – 2017-06-22 (×4): 2000 [IU] via ORAL
  Filled 2017-06-18 (×4): qty 2

## 2017-06-18 MED ORDER — ENOXAPARIN SODIUM 40 MG/0.4ML ~~LOC~~ SOLN
40.0000 mg | SUBCUTANEOUS | Status: DC
  Administered 2017-06-18 – 2017-06-21 (×4): 40 mg via SUBCUTANEOUS
  Filled 2017-06-18 (×4): qty 0.4

## 2017-06-18 MED ORDER — LABETALOL HCL 5 MG/ML IV SOLN: 5.0000 mg | INTRAVENOUS | Status: DC | PRN

## 2017-06-18 MED ORDER — ACETAMINOPHEN 500 MG PO TABS: 500.0000 mg | ORAL_TABLET | ORAL | Status: DC | PRN

## 2017-06-18 MED ORDER — LEVOTHYROXINE SODIUM 25 MCG PO TABS
25.0000 ug | ORAL_TABLET | Freq: Every day | ORAL | Status: DC
  Administered 2017-06-19 – 2017-06-22 (×4): 25 ug via ORAL
  Filled 2017-06-18 (×4): qty 1

## 2017-06-18 MED ORDER — METOPROLOL SUCCINATE ER 25 MG PO TB24
25.0000 mg | ORAL_TABLET | Freq: Two times a day (BID) | ORAL | Status: DC
  Administered 2017-06-19 – 2017-06-22 (×7): 25 mg via ORAL
  Filled 2017-06-18 (×7): qty 1

## 2017-06-18 MED ORDER — VANCOMYCIN HCL 500 MG IV SOLR
500.0000 mg | Freq: Two times a day (BID) | INTRAVENOUS | Status: DC
  Administered 2017-06-19 – 2017-06-21 (×4): 500 mg via INTRAVENOUS
  Filled 2017-06-18 (×6): qty 500

## 2017-06-18 NOTE — ED Provider Notes (Signed)
[facility]-EMERGENCY DEPT Provider Note   CSN: [id number] Arrival date & time: 06/18/17  1409   History   Chief Complaint Chief Complaint  Patient presents with  . Weakness    HPI Donald Montoya is a 56 y.o. male.  Patient presented with mental status changes, lack of baseline responsiveness and possible left sided droop at his nursing facility, [facility], where he abides secondary to a stroke according to the patient. Patient's past medical history is significant for dementia, hypertension, and hypothyroidism. He is unable to completely answer most questions as he simply responds that he had a stroke at some point. Patient was eventually able to deny chest pain, abdominal pain, headache, muscle aches or pain, palpitations, shortness of breath, but was unable to further answer questions. Call was placed to [facility], individual answering indicated that the patient had experienced tachycardia (125), hypertension (181/104), was leaning to his left more than usual, and did not eat a double portion of his meal. Due to this, the nurse PA at the facility was called, who said that he should visit the ED. In addition, they stated that he typically eats two meal portions, and responds more rapidly to verbal commands than he had been since 12:45PM today. As such, they called EMS as they were no comfortable treating his change in status.       Past Medical History:  Diagnosis Date  . Dementia   . Hypertension   . Thyroid disease     Patient Active Problem List   Diagnosis Date Noted  . Sepsis (HCC) 06/18/2017  . AKI (acute kidney injury) (HCC) 06/18/2017  . Hypernatremia 06/18/2017  . Near syncope 10/13/2015  . Bradycardia 10/13/2015  . Hypertension 10/13/2015  . Dementia 10/13/2015  . Syncope, cardiogenic 10/13/2015    History reviewed. No pertinent surgical history.     Home Medications    Prior to Admission medications   Medication Sig Start Date End Date Taking?  Authorizing Provider  acetaminophen (TYLENOL) 500 MG tablet Take 500 mg by mouth every 4 (four) hours as needed for moderate pain (do not exceed 2000mg  in 24 hours). Do not exceed 2000mg  in 24 hours   Yes [provider]  alum & mag hydroxide-simeth (MINTOX) 200-200-20 MG/5ML suspension Take 30 mLs by mouth every 6 (six) hours as needed for indigestion or heartburn.   Yes [provider]  aspirin EC 81 MG tablet Take 81 mg by mouth daily.   Yes [provider]  atorvastatin (LIPITOR) 10 MG tablet Take 10 mg by mouth at bedtime.   Yes [provider]  Cholecalciferol (VITAMIN D) 2000 UNITS CAPS Take 2,000 Units by mouth daily.   Yes [provider]  divalproex (DEPAKOTE ER) 250 MG 24 hr tablet Take 750 mg by mouth at bedtime.   Yes [provider]  guaifenesin (ROBITUSSIN) 100 MG/5ML syrup Take 200 mg by mouth every 6 (six) hours as needed for cough (no more than 4 doses in 24 hours).   Yes [provider]  levothyroxine (SYNTHROID, LEVOTHROID) 25 MCG tablet Take 25 mcg by mouth daily before breakfast.   Yes [provider]  lisinopril (PRINIVIL,ZESTRIL) 2.5 MG tablet Take 2.5 mg by mouth daily.   Yes [provider]  loperamide (IMODIUM) 2 MG capsule Take 2 mg by mouth as needed for diarrhea or loose stools (no more than 8 doses in 24 hours).    Yes [provider]  magnesium hydroxide (MILK OF MAGNESIA) 400 MG/5ML suspension  Take 30 mLs by mouth daily as needed for mild constipation or moderate constipation. Reported on 03/12/2016   Yes [provider]  metoprolol succinate (TOPROL-XL) 25 MG 24 hr tablet Take 25 mg by mouth 2 (two) times daily.   Yes [provider]  Neomycin-Bacitracin-Polymyxin (TRIPLE ANTIBIOTIC) [phone] OINT Apply 1 application topically as needed (skin tears/abrasions).   Yes [provider]  Nutritional Supplements (NUTRITIONAL DRINK MIX PO) Take 1 Container  by mouth 2 (two) times daily. Mighty shake    Yes [provider]  QUEtiapine (SEROQUEL) 25 MG tablet Take 12.5 mg by mouth at bedtime.    Yes [provider]  sertraline (ZOLOFT) 50 MG tablet Take 75 mg by mouth daily.   Yes [provider]  triamcinolone cream (KENALOG) 0.1 % Apply 1 application topically 2 (two) times daily as needed (to rash on legs for flareups).   Yes [provider]  Zinc Oxide (BALMEX EX) Apply 1 application topically as needed (apply to buttocks and groin with each incontinent change).    Yes [provider]    Family History Family History  Problem Relation Age of Onset  . Family history unknown: Yes    Social History Social History  Substance Use Topics  . Smoking status: Never Smoker  . Smokeless tobacco: Never Used  . Alcohol use No     Allergies   Patient has no known allergies.   Review of Systems Review of Systems  Unable to perform ROS: Dementia  Eyes: Negative for pain and visual disturbance.  Respiratory: Negative for shortness of breath.   Cardiovascular: Negative for chest pain.  Gastrointestinal: Negative for abdominal pain and vomiting.  Genitourinary: Negative for dysuria and frequency.  Neurological: Negative for seizures and syncope.  Psychiatric/Behavioral: Positive for confusion.  All other systems reviewed and are negative.    Physical Exam Updated Vital Signs BP (!) 150/88 (BP Location: Right Arm)   Pulse 74   Temp 98.1 F (36.7 C) (Oral)   Resp 18   Ht 6\' 1"  (1.854 m)   Wt 67.1 kg (148 lb)   SpO2 93%   BMI 19.53 kg/m   Physical Exam  Constitutional: He appears well-developed and well-nourished.  HENT:  Head: Normocephalic and atraumatic.  Mouth/Throat: No oropharyngeal exudate.  Eyes: Pupils are equal, round, and reactive to light. Conjunctivae are normal.  Difficult to assess given patients lack of cooperation/confusion.    Neck: Neck supple.  Cardiovascular:  Regular rhythm.  Tachycardia present.   Pulmonary/Chest: Effort normal and breath sounds normal. No stridor. No respiratory distress. He has no wheezes.  Abdominal: Soft. Bowel sounds are normal. He exhibits no distension. There is no tenderness. There is no guarding.  Musculoskeletal: He exhibits no edema or tenderness.  Neurological: He is alert. He has normal strength. He is disoriented. Coordination abnormal.  Skin: Skin is warm and dry. Capillary refill takes less than 2 seconds.  Psychiatric: He has a normal mood and affect. Cognition and memory are impaired. He exhibits abnormal recent memory and abnormal remote memory. He is attentive.  Nursing note and vitals reviewed.    ED Treatments / Results  Labs (all labs ordered are listed, but only abnormal results are displayed) Labs Reviewed  CBC - Abnormal; Notable for the following:       Result Value   WBC 11.0 (*)    All other components within normal limits  COMPREHENSIVE METABOLIC PANEL - Abnormal; Notable for the following:    Sodium  157 (*)    Chloride 118 (*)    Glucose, Bld 214 (*)    BUN 36 (*)    Creatinine, Ser 1.51 (*)    Total Protein 6.1 (*)    Albumin 3.2 (*)    AST 45 (*)    GFR calc non Af Amer 50 (*)    GFR calc Af Amer 57 (*)    All other components within normal limits  OSMOLALITY, URINE - Abnormal; Notable for the following:    Osmolality, Ur 963 (*)    All other components within normal limits  CBC - Abnormal; Notable for the following:    WBC 11.2 (*)    All other components within normal limits  BASIC METABOLIC PANEL - Abnormal; Notable for the following:    Sodium 157 (*)    Chloride 120 (*)    Glucose, Bld 103 (*)    BUN 27 (*)    Calcium 8.2 (*)    All other components within normal limits  I-STAT CG4 LACTIC ACID, ED - Abnormal; Notable for the following:    Lactic Acid, Venous 2.71 (*)    All other components within normal limits  MRSA PCR SCREENING  URINE CULTURE  CULTURE, BLOOD  (ROUTINE X 2)  CULTURE, BLOOD (ROUTINE X 2)  URINALYSIS, ROUTINE W REFLEX MICROSCOPIC  TSH  VITAMIN B12  LACTIC ACID, PLASMA  LACTIC ACID, PLASMA  PROCALCITONIN  PROTIME-INR  APTT  HIV ANTIBODY (ROUTINE TESTING)  CREATININE, SERUM    EKG  EKG Interpretation  Date/Time:  Monday June 18 2017 14:36:44 EDT Ventricular Rate:  114 PR Interval:    QRS Duration: 81 QT Interval:  368 QTC Calculation: 507 R Axis:   27 Text Interpretation:  Sinus tachycardia Confirmed by Cathren Laine ([id number]) on 06/18/2017 2:49:41 PM       Radiology Dg Chest 2 View  Result Date: 06/18/2017 CLINICAL DATA:  Weakness starting about a week ago, fever, dementia. EXAM: CHEST  2 VIEW COMPARISON:  Chest x-ray dated 03/12/2016. FINDINGS: Study is hypoinspiratory. Given the low lung volumes, lungs appear clear. No confluent opacity to suggest a developing pneumonia. No pleural effusions seen. Heart size and mediastinal contours are within normal limits. No acute or suspicious osseous finding. IMPRESSION: No active cardiopulmonary disease. No evidence of pneumonia or pulmonary edema. Electronically Signed   By: Bary Richard M.D.   On: 06/18/2017 16:21   Ct Head Wo Contrast  Result Date: 06/18/2017 CLINICAL DATA:  Worsening confusion today, unsteady gait. History of severe dementia, brain injury. EXAM: CT HEAD WITHOUT CONTRAST TECHNIQUE: Contiguous axial images were obtained from the base of the skull through the vertex without intravenous contrast. COMPARISON:  CT HEAD Mar 12, 2016 FINDINGS: BRAIN: No intraparenchymal hemorrhage, mass effect nor midline shift. 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 Numerous old basal ganglia and thalamus infarcts with ex vacuo dilatation frontal horns and third ventricle. Moderate to severe parenchymal brain volume loss, advanced for age. Old RIGHT frontal lobe convexity infarct. Patchy supratentorial white matter hypodensities. No midline shift or mass effect. No abnormal  extra-axial fluid collections. Basal cisterns are patent. VASCULAR: Moderate calcific atherosclerosis of the carotid siphons. SKULL: No skull fracture. Old distal LEFT nasal bone fracture. A No significant scalp soft tissue swelling. SINUSES/ORBITS: Minimal LEFT mastoid effusion. Paranasal sinus are well aerated. The included ocular globes and orbital contents are non-suspicious. OTHER: None. IMPRESSION: 1. No acute intracranial process. 2. Re- demonstration of multiple old cerebellar, old basal ganglia and old thalamus  infarcts. 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 3. Moderate to severe parenchymal brain volume loss, advanced for age. Electronically Signed   By: Awilda Metro M.D.   On: 06/18/2017 21:34    Procedures Procedures (including critical care time)  Medications Ordered in ED Medications  alum & mag hydroxide-simeth (MAALOX/MYLANTA) 200-200-20 MG/5ML suspension 30 mL (not administered)  TRIPLE ANTIBIOTIC [phone] OINT 1 application (not administered)  aspirin EC tablet 81 mg (not administered)  cholecalciferol (VITAMIN D) tablet 2,000 Units (not administered)  guaifenesin (ROBITUSSIN) 100 MG/5ML syrup 200 mg (not administered)  levothyroxine (SYNTHROID, LEVOTHROID) tablet 25 mcg (not administered)  loperamide (IMODIUM) capsule 2 mg (not administered)  magnesium hydroxide (MILK OF MAGNESIA) suspension 30 mL (not administered)  free water 250 mL (250 mLs Per Tube Not Given 06/19/17 0730)  enoxaparin (LOVENOX) injection 40 mg (40 mg Subcutaneous Given 06/18/17 2153)  acetaminophen (TYLENOL) tablet 650 mg (not administered)    Or  acetaminophen (TYLENOL) suppository 650 mg (not administered)  ondansetron (ZOFRAN) tablet 4 mg (not administered)    Or  ondansetron (ZOFRAN) injection 4 mg (not administered)  0.45 % sodium chloride infusion ( Intravenous Duplicate 06/18/17 1930)  0.45 % sodium chloride infusion ( Intravenous New Bag/Given 06/18/17 1952)    piperacillin-tazobactam (ZOSYN) IVPB 3.375 g (3.375 g Intravenous New Bag/Given 06/19/17 0509)  divalproex (DEPAKOTE ER) 24 hr tablet 750 mg (not administered)  metoprolol succinate (TOPROL-XL) 24 hr tablet 25 mg (not administered)  labetalol (NORMODYNE,TRANDATE) injection 5-10 mg (not administered)  atorvastatin (LIPITOR) tablet 10 mg (not administered)  vancomycin (VANCOCIN) 500 mg in sodium chloride 0.9 % 100 mL IVPB (not administered)  sodium chloride 0.9 % bolus 1,000 mL (0 mLs Intravenous Stopped 06/18/17 1743)  sodium chloride 0.9 % bolus 1,000 mL (0 mLs Intravenous Stopped 06/18/17 1849)  piperacillin-tazobactam (ZOSYN) IVPB 3.375 g (0 g Intravenous Stopped 06/18/17 2329)  valproate (DEPACON) 750 mg in dextrose 5 % 50 mL IVPB (0 mg Intravenous Stopped 06/18/17 2253)  vancomycin (VANCOCIN) 1,500 mg in sodium chloride 0.9 % 500 mL IVPB (0 mg Intravenous Stopped 06/19/17 0254)     Initial Impression / Assessment and Plan / ED Course  I have reviewed the triage vital signs and the nursing notes.  Pertinent labs & imaging results that were available during my care of the patient were reviewed by me and considered in my medical decision making (see chart for details).    Donald Montoya is a 56 year old male who presented with possible mental status changes and concern for left sided droop as per the nursing facility where he abides. Given his presentation, tachycardia, and inability to complete a review of systems based on his baseline mental status, significant evaluation for possible infection was undertaken. Chest x-ray, UA, CBC, CMP, blood cultures, urine culture were ordered.  3:30 PM, CBC returned indicating mild leukocytosis of 11.0, increased lactic acid 2.71. Further evaluation still pending.  4:00 PM, Patient signed out to Dr. Donnald Garre for follow-up with labs and consideration for admission.  5:30 PM, Patient admitted to the floor for monitoring, and treatment of his hyperkalemia,  and alteration in mental status.   Patient seen and evaluated with Dr. Denton Lank.  Final Clinical Impressions(s) / ED Diagnoses   Final diagnoses:  Transient alteration of awareness  Tachycardia with hypertension   New Prescriptions Current Discharge Medication List       Lanelle Bal, MD 06/19/17 3568    Cathren Laine, MD 06/19/17 [phone]

## 2017-06-18 NOTE — ED Notes (Signed)
Patient transported to X-ray 

## 2017-06-18 NOTE — H&P (Signed)
[facility] H&P   Patient Demographics:    Donald Montoya, is a 56 y.o. male  MRN: [id number]   DOB - 12/10/1960  Admit Date - 06/18/2017  Outpatient Primary MD for the patient is [name], MD  Referring MD: Dr. Clarice Pole  Outpatient Specialists: None  Patient coming from: [facility] memory care facility  Chief Complaint  Patient presents with  . Weakness      HPI:    Donald Montoya  is a 56 y.o. male, With history of advanced dementia from? Brain injury, hypothyroidism and hypertension who is a resident of a memory care facility who was brought to the ED by EMS for acute change in mental status since this morning. History obtained from ED note and nursing staff at the facility. At baseline patient is severely demented, ambulates with the help of a walker and has a good appetite. He is aggressive at times, cursing at the nursing staffs and is poorly communicative. This morning he was found to be more confused than usual, unsteady on his legs and swaying to his left side and had poor by mouth intake. (Typically eats 2 meals portions) No recent illness, fevers, chills, nausea, vomiting, complains of chest pain, shortness of breath, abdominal pain, dysuria or diarrhea. No recent change in his medications or witnessed fall.  He was also found to have elevated blood pressure of 181/104 mmHg and tachycardic to 125. EMS was called and patient brought to the ED.  In the ED he had a temperature of 99.60 Fahrenheit, tachycardic in the 110s and was hypotensive with blood pressure of 99/76 mmHg. Blood work showed WBC of 11, normal hemoglobin and platelets. Chemistry shows sodium of 157, chloride of 118, BUN of 36 and creatinine 1.51. Glucose of 214 and lactic acid of 2.71. Blood cultures sent and UA pending. Chest x-ray negative for infection. EKG showed sinus tachycardia at 114  with QTC of 507, no ST-T changes.  Patient given 2 L normal saline bolus, UA and urine culture ordered and hospitalist consulted for admission to telemetry for early sepsis.     Review of systems:    In addition to the HPI above,  Review of systems significantly limited due to patient's severe dementia  A full 10 point Review of Systems was done, except as stated above, all other Review of Systems were negative.   With Past History of the following :    Past Medical History:  Diagnosis Date  . Dementia   . Hypertension   . Thyroid disease       Past surgical history None available   Social History:     Social History  Substance Use Topics  . Smoking status: Never Smoker  . Smokeless tobacco: Never Used  . Alcohol use No     Lives - At memory care facility  Mobility - ambulate with a walker  Family History :     Family History  Problem Relation Age of Onset  . Family history unknown: Yes   No family history available for heart disease, stroke, diabetes mellitus   Home Medications:   Prior to Admission medications   Medication Sig Start Date End Date Taking? Authorizing Provider  acetaminophen (TYLENOL) 500 MG tablet Take 500 mg by mouth every 4 (four) hours as needed for moderate pain (do not exceed 2000mg  in 24 hours). Do not exceed 2000mg  in 24 hours   Yes [provider]  alum & mag hydroxide-simeth (MINTOX) 200-200-20 MG/5ML suspension Take 30 mLs by mouth every 6 (six) hours as needed for indigestion or heartburn.   Yes [provider]  aspirin EC 81 MG tablet Take 81 mg by mouth daily.   Yes [provider]  atorvastatin (LIPITOR) 10 MG tablet Take 10 mg by mouth at bedtime.   Yes [provider]  Cholecalciferol (VITAMIN D) 2000 UNITS CAPS Take 2,000 Units by mouth daily.   Yes [provider]  divalproex (DEPAKOTE ER) 250 MG 24 hr tablet Take 750 mg by mouth at bedtime.   Yes [provider]    guaifenesin (ROBITUSSIN) 100 MG/5ML syrup Take 200 mg by mouth every 6 (six) hours as needed for cough (no more than 4 doses in 24 hours).   Yes [provider]  levothyroxine (SYNTHROID, LEVOTHROID) 25 MCG tablet Take 25 mcg by mouth daily before breakfast.   Yes [provider]  lisinopril (PRINIVIL,ZESTRIL) 2.5 MG tablet Take 2.5 mg by mouth daily.   Yes [provider]  loperamide (IMODIUM) 2 MG capsule Take 2 mg by mouth as needed for diarrhea or loose stools (no more than 8 doses in 24 hours).    Yes [provider]  magnesium hydroxide (MILK OF MAGNESIA) 400 MG/5ML suspension Take 30 mLs by mouth daily as needed for mild constipation or moderate constipation. Reported on 03/12/2016   Yes [provider]  metoprolol succinate (TOPROL-XL) 25 MG 24 hr tablet Take 25 mg by mouth 2 (two) times daily.   Yes [provider]  Neomycin-Bacitracin-Polymyxin (TRIPLE ANTIBIOTIC) [id number] OINT Apply 1 application topically as needed (skin tears/abrasions).   Yes [provider]  Nutritional Supplements (NUTRITIONAL DRINK MIX PO) Take 1 Container by mouth 2 (two) times daily. Mighty shake    Yes [provider]  QUEtiapine (SEROQUEL) 25 MG tablet Take 12.5 mg by mouth at bedtime.    Yes [provider]  sertraline (ZOLOFT) 50 MG tablet Take 75 mg by mouth daily.   Yes [provider]  triamcinolone cream (KENALOG) 0.1 % Apply 1 application topically 2 (two) times daily as needed (to rash on legs for flareups).   Yes [provider]  Zinc Oxide (BALMEX EX) Apply 1 application topically as needed (apply to buttocks and groin with each incontinent change).    Yes [provider]     Allergies:    No Known Allergies   Physical Exam:   Vitals  Blood pressure 140/66, pulse 81, temperature 99.6 F (37.6 C), temperature source Oral, resp. rate (!) 22, SpO2 94 %.    General Middle aged male  lying in bed, has poor personal hygiene, not in distress HEENT: Pupils reactive bilaterally, EOMI, no pallor, poor dentition, dry oral mucosa, supple neck Chest: Clear to auscultation bilaterally, no added sounds  CVS: Normal S1 and S2, no murmurs rub or gallop GI: Soft, nondistended, nontender, bowel sounds present  Musculoskeletal: Warm, no edema CNS: Alert and awake, knows his in the hospital but unable to communicate further, normal motor tone and power in all extremities, normal reflexes    Data Review:    CBC  Recent Labs Lab 06/18/17 1437  WBC 11.0*  HGB 15.6  HCT 49.6  PLT 198  MCV 92.5  MCH 29.1  MCHC 31.5  RDW 15.2   ------------------------------------------------------------------------------------------------------------------  Chemistries   Recent Labs Lab 06/18/17 1437  NA 157*  K 4.3  CL 118*  CO2 26  GLUCOSE 214*  BUN 36*  CREATININE 1.51*  CALCIUM 9.3  AST 45*  ALT 20  ALKPHOS 55  BILITOT 1.1   ------------------------------------------------------------------------------------------------------------------ CrCl cannot be calculated (Unknown ideal weight.). ------------------------------------------------------------------------------------------------------------------ No results for input(s): TSH, T4TOTAL, T3FREE, THYROIDAB in the last 72 hours.  Invalid input(s): FREET3  Coagulation profile No results for input(s): INR, PROTIME in the last 168 hours. ------------------------------------------------------------------------------------------------------------------- No results for input(s): DDIMER in the last 72 hours. -------------------------------------------------------------------------------------------------------------------  Cardiac Enzymes No results for input(s): CKMB, TROPONINI, MYOGLOBIN in the last 168 hours.  Invalid input(s):  CK ------------------------------------------------------------------------------------------------------------------ No results found for: BNP   ---------------------------------------------------------------------------------------------------------------  Urinalysis    Component Value Date/Time   COLORURINE YELLOW 12/02/2015 1100   APPEARANCEUR CLOUDY (A) 12/02/2015 1100   LABSPEC 1.014 12/02/2015 1100   PHURINE 7.0 12/02/2015 1100   GLUCOSEU NEGATIVE 12/02/2015 1100   HGBUR NEGATIVE 12/02/2015 1100   BILIRUBINUR NEGATIVE 12/02/2015 1100   KETONESUR NEGATIVE 12/02/2015 1100   PROTEINUR NEGATIVE 12/02/2015 1100   UROBILINOGEN 1.0 08/27/2009 2303   NITRITE NEGATIVE 12/02/2015 1100   LEUKOCYTESUR NEGATIVE 12/02/2015 1100    ----------------------------------------------------------------------------------------------------------------   Imaging Results:    Dg Chest 2 View  Result Date: 06/18/2017 CLINICAL DATA:  Weakness starting about a week ago, fever, dementia. EXAM: CHEST  2 VIEW COMPARISON:  Chest x-ray dated 03/12/2016. FINDINGS: Study is hypoinspiratory. Given the low lung volumes, lungs appear clear. No confluent opacity to suggest a developing pneumonia. No pleural effusions seen. Heart size and mediastinal contours are within normal limits. No acute or suspicious osseous finding. IMPRESSION: No active cardiopulmonary disease. No evidence of pneumonia or pulmonary edema. Electronically Signed   By: Bary Richard M.D.   On: 06/18/2017 16:21    My personal review of EKG: Sinus tachycardia at 114 with prolonged QTC of 507, no ST-T changes   Assessment & Plan:    Principal Problem:   Sepsis (HCC) Source unclear. Suspect urinary. Blood cultures sent from the ED. UA and urine culture pending. Once labs sent the cover empirically with IV vancomycin and Zosyn. Check ammonia, B12 and TSH. Check HIV antibody. Follow lactic acid and procalcitonin. IV hydration with half-normal  saline .  Active Problems: Acute kidney injury Secondary to sepsis and dehydration. Discontinue lisinopril. Check UA and urine osmolality. Monitor with IV fluids.  Acute encephalopathy  Suspect due to sepsis. Check head CT, ammonia, B12, TSH. Has severe underlying dementia.  Hypernatremia Secondary to dehydration. Check UA and urine osmolality. Monitor with half normal saline. Add free water.   Advance dementia with? Delirium On depakote. Hold Zoloft and Seroquel due to prolonged QTC  Essential hypertension Continue beta blocker  Hyperlipidemia Continue statin.  Hyperglycemia No history of diabetes. Check A1c.  Hypothyroidism Continue Synthroid. Check TSH.   DVT Prophylaxis   Lovenox  AM Labs Ordered, also please review Full Orders  Family Communication: none at bedside. Per facility staff patient has a son and a daughter who visit him rarely.  Code Status full code  Likely DC to  SNF  Condition GUARDED    Consults called:  none   Admission status: inpatient  Time spent in minutes : 60   Eddie North M.D on 06/18/2017 at 5:50 PM  Between 7am to 7pm - Pager - [phone]. After 7pm go to [email or web address] - password [facility]  [facility] - Office  [phone]

## 2017-06-18 NOTE — ED Notes (Signed)
Pt attempted to urinate in urinal, was not able to but stated he would like to keep trying.

## 2017-06-18 NOTE — Progress Notes (Signed)
Pharmacy Antibiotic Note  Donald Montoya is a 56 y.o. male admitted on 06/18/2017 with sepsis.  Pharmacy has been consulted for vancomycin and zosyn dosing. WBC count is slightly elevated at 11.0 and lactic acid elevated at 2.71. Cultures are pending at this time.   Plan: Vancomycin 1500mg  IV x 1 loading dose  Vancomycin 500 IV every 12 hours.  Goal trough 15-20 mcg/mL. Zosyn 3.375g IV q8h (4 hour infusion). Follow cultures, LOT, clinical progression, renal function, vancomycin trough as indicated.   Temp (24hrs), Avg:99.6 F (37.6 C), Min:99.6 F (37.6 C), Max:99.6 F (37.6 C)   Recent Labs Lab 06/18/17 1437 06/18/17 1517  WBC 11.0*  --   CREATININE 1.51*  --   LATICACIDVEN  --  2.71*     No Known Allergies  Antimicrobials this admission: Vancomycin 8/27>> Zosyn 8/27>>    Microbiology results: 8/27 Urine Cx:  8/27 Blood Cx:    Thank you for allowing pharmacy to be a part of this patient's care.  Blake Divine, Pharm.D. PGY1 Pharmacy Resident 06/18/2017 5:57 PM Main Pharmacy: [phone]

## 2017-06-18 NOTE — ED Notes (Signed)
ED Provider at bedside. 

## 2017-06-18 NOTE — ED Triage Notes (Signed)
Pt arrives with [facility] from [facility] with weakness that started about 1 week ago and was seen by provider today and sent out to by evaluated. Pt had fever with ems, and sinus tach. Pt has dementia at baseline

## 2017-06-19 DIAGNOSIS — G9341 Metabolic encephalopathy: Principal | ICD-10-CM

## 2017-06-19 LAB — BASIC METABOLIC PANEL
ANION GAP: 5 (ref 5–15)
Anion gap: 8 (ref 5–15)
BUN: 27 mg/dL — AB (ref 6–20)
BUN: 26 mg/dL — ABNORMAL HIGH (ref 6–20)
CALCIUM: 8.1 mg/dL — AB (ref 8.9–10.3)
CHLORIDE: 120 mmol/L — AB (ref 101–111)
CO2: 29 mmol/L (ref 22–32)
CO2: 28 mmol/L (ref 22–32)
CREATININE: 1.07 mg/dL (ref 0.61–1.24)
Calcium: 8.2 mg/dL — ABNORMAL LOW (ref 8.9–10.3)
Chloride: 118 mmol/L — ABNORMAL HIGH (ref 101–111)
Creatinine, Ser: 1.12 mg/dL (ref 0.61–1.24)
Glucose, Bld: 103 mg/dL — ABNORMAL HIGH (ref 65–99)
Glucose, Bld: 136 mg/dL — ABNORMAL HIGH (ref 65–99)
Potassium: 4 mmol/L (ref 3.5–5.1)
Potassium: 3.7 mmol/L (ref 3.5–5.1)
SODIUM: 151 mmol/L — AB (ref 135–145)
Sodium: 157 mmol/L — ABNORMAL HIGH (ref 135–145)

## 2017-06-19 LAB — CREATININE, SERUM: CREATININE: 1.16 mg/dL (ref 0.61–1.24)

## 2017-06-19 LAB — URINE CULTURE: CULTURE: NO GROWTH

## 2017-06-19 LAB — MRSA PCR SCREENING: MRSA BY PCR: NEGATIVE

## 2017-06-19 LAB — HIV ANTIBODY (ROUTINE TESTING W REFLEX): HIV Screen 4th Generation wRfx: NONREACTIVE

## 2017-06-19 LAB — VITAMIN B12: Vitamin B-12: 399 pg/mL (ref 180–914)

## 2017-06-19 LAB — TSH: TSH: 1.088 u[IU]/mL (ref 0.350–4.500)

## 2017-06-19 LAB — AMMONIA: Ammonia: 37 umol/L — ABNORMAL HIGH (ref 9–35)

## 2017-06-19 MED ORDER — ORAL CARE MOUTH RINSE
15.0000 mL | Freq: Two times a day (BID) | OROMUCOSAL | Status: DC
  Administered 2017-06-19 – 2017-06-22 (×5): 15 mL via OROMUCOSAL

## 2017-06-19 MED ORDER — DEXTROSE 5 % IV SOLN
INTRAVENOUS | Status: DC
  Administered 2017-06-19 – 2017-06-20 (×3): via INTRAVENOUS

## 2017-06-19 NOTE — Progress Notes (Signed)
Pt had 8 beat run Vtach, denied chest pain and SOB, VS stable. Opyd, MD notified.

## 2017-06-19 NOTE — Evaluation (Signed)
Physical Therapy Evaluation Patient Details Name: Donald Montoya MRN: [id number] DOB: 1961/06/03 Today's Date: 06/19/2017   History of Present Illness  Donald Montoya  is a 56 y.o. male, With history of advanced dementia from? Brain injury, hypothyroidism and hypertension who is a resident of a memory care facility who was brought to the ED by EMS for acute change in mental status since this morning.  Clinical Impression  Pt admitted with/for AMS, likely due to sepsis.  Pt presently needing minimal assist for basic mobility..  Pt currently limited functionally due to the problems listed. ( See problems list.)   Pt will benefit from PT to maximize function and safety in order to get ready for next venue listed below.     Follow Up Recommendations No PT follow up;Other (comment) (restorative)    Equipment Recommendations  Other (comment);None recommended by PT (TBA)    Recommendations for Other Services       Precautions / Restrictions Precautions Precautions: Fall      Mobility  Bed Mobility Overal bed mobility: Needs Assistance Bed Mobility: Supine to Sit     Supine to sit: Min assist     General bed mobility comments: assisted up and forward, pt scooted once up.  Transfers Overall transfer level: Needs assistance   Transfers: Sit to/from Stand Sit to Stand: Min assist         General transfer comment: min for stability and helping come forward.  Ambulation/Gait Ambulation/Gait assistance: Min assist Ambulation Distance (Feet): 70 Feet Assistive device: Rolling walker (2 wheeled) Gait Pattern/deviations: Step-through pattern   Gait velocity interpretation: Below normal speed for age/gender General Gait Details: mildly ataxic with uncoordinated swing through with toe contact.  L ankle mildly unstable with lateral rollout  Stairs            Wheelchair Mobility    Modified Rankin (Stroke Patients Only)       Balance                                             Pertinent Vitals/Pain Pain Assessment: No/denies pain    Home Living Family/patient expects to be discharged to:: Skilled nursing facility                      Prior Function Level of Independence: Needs assistance   Gait / Transfers Assistance Needed: Assist and use of the RW           Hand Dominance        Extremity/Trunk Assessment   Upper Extremity Assessment Upper Extremity Assessment:  (functional and generally uncoordinated)    Lower Extremity Assessment Lower Extremity Assessment: Overall WFL for tasks assessed;LLE deficits/detail LLE Deficits / Details: mildly weak and uncoordinated       Communication   Communication: No difficulties  Cognition Arousal/Alertness: Awake/alert Behavior During Therapy: Restless;WFL for tasks assessed/performed Overall Cognitive Status: History of cognitive impairments - at baseline                                 General Comments: following simple direction with supplementary cues      General Comments      Exercises     Assessment/Plan    PT Assessment Patient needs continued PT services  PT Problem List Decreased strength;Decreased activity  tolerance;Decreased balance;Decreased mobility;Decreased coordination;Decreased cognition;Decreased knowledge of use of DME       PT Treatment Interventions Gait training;Functional mobility training;Therapeutic activities;Balance training;Patient/family education;DME instruction    PT Goals (Current goals can be found in the Care Plan section)  Acute Rehab PT Goals Patient Stated Goal: pt unable to participate in goal setting PT Goal Formulation: Patient unable to participate in goal setting Time For Goal Achievement: 06/26/17 Potential to Achieve Goals: Fair    Frequency Min 2X/week   Barriers to discharge        Co-evaluation               AM-PAC PT "6 Clicks" Daily Activity  Outcome Measure  Difficulty turning over in bed (including adjusting bedclothes, sheets and blankets)?: A Little Difficulty moving from lying on back to sitting on the side of the bed? : A Lot Difficulty sitting down on and standing up from a chair with arms (e.g., wheelchair, bedside commode, etc,.)?: A Little Help needed moving to and from a bed to chair (including a wheelchair)?: A Little Help needed walking in hospital room?: A Little Help needed climbing 3-5 steps with a railing? : A Lot 6 Click Score: 16    End of Session   Activity Tolerance: Patient tolerated treatment well Patient left: in chair;with call bell/phone within reach;with chair alarm set Nurse Communication: Mobility status PT Visit Diagnosis: Unsteadiness on feet (R26.81);Other abnormalities of gait and mobility (R26.89)    Time: 1610-9604 PT Time Calculation (min) (ACUTE ONLY): 23 min   Charges:   PT Evaluation $PT Eval Moderate Complexity: 1 Mod PT Treatments $Gait Training: 8-22 mins   PT G Codes:        2017/07/17  Donald Montoya, PT [phone]  (pager)  Donald Montoya July 17, 2017, 5:16 PM

## 2017-06-19 NOTE — Progress Notes (Addendum)
PROGRESS NOTE  Donald Montoya  ZOX:[id number] DOB: [birth year] DOA: 06/18/2017 PCP: [name], MD   Brief Narrative: Donald Montoya is a 56 y.o. male with a history of advanced dementia, possible history of brain injury, hypothyroidism, and HTN who was sent from memory care facility for change in mental status. He became tachycardic, hypertensive, and responded more slowly to questions than usual, per report and had decreased per oral intake for about 24 hours prior to arrival. He was also more unsteady with possible left facial droop and leaning to the left per staff. On arrival to ED temperature was 99.22F, sinus tachycardia in 110's, and hypotensive to 99/76. WBC modestly elevated to 11 and sodium noted to be acute up to 157. BUN 36 and creatinine 1.51 up from baseline. Lactate initially elevated to 2.71. Early sepsis was suspected, though UA and CXR showed no evidence of infection. 2L NS were administered, vancomycin and zosyn started, and the patient was admitted.  Assessment & Plan: Principal Problem:   Sepsis (HCC) Active Problems:   Hypertension   Dementia   AKI (acute kidney injury) (HCC)   Hypernatremia  Acute encephalopathy: Unclear etiology, possibly hypernatremia. CT head showed no acute abnormality. TSH wnl, B12 wnl. HIV neg. - Add on ammonia, though LFTs not significantly elevated.  - Treat hypernatremia as below, possible sepsis - If no improvement with correction of metabolic derangements, would consult neurology.  Hypernatremia: Due to dehydration. Urine osmolality elevated, not consistent with DI. No diuretics at baseline.  - Discussed with Dr. Allena Katz, nephrology. Free water deficit >4L. No change this AM with 2L NS followed by 1/2NS. Appears more euvolemic. Making good UOP. Recommendation was for D5W @150cc /hr over the next 24 hours. Repeat BMP showed improvement to Na of 151 (at goal of 0.38mEq/L/hr). Will continue D5W at lower rate of 100cc/hr overnight, recheck  in AM.   Acute kidney injury: Due to dehydration. Improving.  - Hold lisinopril.  - Continue monitoring  Possible sepsis: Based on tachycardia and mild leukocytosis, initial lactate elevation. No evidence of urinary, pulmonary, or cutaneous sources of infection.  - Will monitor cultures for another 24 hours with low threshold to DC abx if cultures remain negative.   Advanced dementia:  - Continue depakote.  - Holding zoloft, seroquel due to prolonged QT.   Essential hypertension: Chronic, stable.  - Continue metoprolol 25mg  BID  Hyperlipidemia: Chronic, stable - Continue statin  Hyperglycemia: Glucose 214 on arrival. No history of diabetes.  - HbA1c pending  Hypothyroidism: TSH 1.088 - Continue synthroid  DVT prophylaxis: Lovenox Code Status: Full Family Communication: None at bedside Disposition Plan: Eventual DC back to SNF  Consultants:   Nephrology, Dr. Allena Katz by phone  Procedures:   None  Antimicrobials:  Vancomycin, zosyn 8/27 >>    Subjective: Patient without complaints. Denies pain. Remains very confused.   Objective: Vitals:   06/18/17 2130 06/19/17 0157 06/19/17 0559 06/19/17 0947  BP: 140/78 138/72 (!) 150/88 (!) 164/88  Pulse: 83 72 74 74  Resp: 18 18 18    Temp: 98.9 F (37.2 C) 98.4 F (36.9 C) 98.1 F (36.7 C)   TempSrc: Oral Oral Oral   SpO2: 97% 92% 93%   Weight:      Height:        Intake/Output Summary (Last 24 hours) at 06/19/17 1357 Last data filed at 06/19/17 1224  Gross per 24 hour  Intake          1311.67 ml  Output  500 ml  Net           811.67 ml   Filed Weights   06/18/17 1800  Weight: 67.1 kg (148 lb)    Gen: Unkempt 55yo male in no acute distress Pulm: Non-labored breathing room air. Clear to auscultation bilaterally.  CV: Regular rate and rhythm. No murmur, rub, or gallop. No JVD, no pedal edema. GI: Abdomen soft, non-tender, non-distended, with normoactive bowel sounds. No organomegaly or masses  felt. Ext: Warm, no deformities. Skin: No wounds noted.  Neuro: Alert, oriented to person and hospital, not time or situation. No focal neurological deficits, though patient not cooperative with full exam. Psych: Judgement and insight appear impaired.    Data Reviewed: I have personally reviewed following labs and imaging studies  CBC:  Recent Labs Lab 06/18/17 1437 06/18/17 1930  WBC 11.0* 11.2*  HGB 15.6 14.1  HCT 49.6 45.2  MCV 92.5 92.8  PLT 198 167   Basic Metabolic Panel:  Recent Labs Lab 06/18/17 1437 06/18/17 1930 06/19/17 0304  NA 157*  --  157*  K 4.3  --  4.0  CL 118*  --  120*  CO2 26  --  29  GLUCOSE 214*  --  103*  BUN 36*  --  27*  CREATININE 1.51* 1.16 1.12  CALCIUM 9.3  --  8.2*   GFR: Estimated Creatinine Clearance: 69.1 mL/min (by C-G formula based on SCr of 1.12 mg/dL). Liver Function Tests:  Recent Labs Lab 06/18/17 1437  AST 45*  ALT 20  ALKPHOS 55  BILITOT 1.1  PROT 6.1*  ALBUMIN 3.2*   No results for input(s): LIPASE, AMYLASE in the last 168 hours. No results for input(s): AMMONIA in the last 168 hours. Coagulation Profile:  Recent Labs Lab 06/18/17 1930  INR 1.15   Cardiac Enzymes: No results for input(s): CKTOTAL, CKMB, CKMBINDEX, TROPONINI in the last 168 hours. BNP (last 3 results) No results for input(s): PROBNP in the last 8760 hours. HbA1C: No results for input(s): HGBA1C in the last 72 hours. CBG: No results for input(s): GLUCAP in the last 168 hours. Lipid Profile: No results for input(s): CHOL, HDL, LDLCALC, TRIG, CHOLHDL, LDLDIRECT in the last 72 hours. Thyroid Function Tests:  Recent Labs  06/18/17 1848  TSH 1.088   Anemia Panel:  Recent Labs  06/18/17 1848  VITAMINB12 399   Urine analysis:    Component Value Date/Time   COLORURINE YELLOW 06/18/2017 1808   APPEARANCEUR CLEAR 06/18/2017 1808   LABSPEC 1.024 06/18/2017 1808   PHURINE 5.0 06/18/2017 1808   GLUCOSEU NEGATIVE 06/18/2017 1808    HGBUR NEGATIVE 06/18/2017 1808   BILIRUBINUR NEGATIVE 06/18/2017 1808   KETONESUR NEGATIVE 06/18/2017 1808   PROTEINUR NEGATIVE 06/18/2017 1808   UROBILINOGEN 1.0 08/27/2009 2303   NITRITE NEGATIVE 06/18/2017 1808   LEUKOCYTESUR NEGATIVE 06/18/2017 1808   Recent Results (from the past 240 hour(s))  Culture, blood (routine x 2)     Status: None (Preliminary result)   Collection Time: 06/18/17  2:37 PM  Result Value Ref Range Status   Specimen Description BLOOD LEFT ANTECUBITAL  Final   Special Requests   Final    BOTTLES DRAWN AEROBIC AND ANAEROBIC Blood Culture adequate volume   Culture NO GROWTH < 24 HOURS  Final   Report Status PENDING  Incomplete  Culture, blood (routine x 2)     Status: None (Preliminary result)   Collection Time: 06/18/17  3:20 PM  Result Value Ref Range Status   Specimen  Description BLOOD RIGHT ANTECUBITAL  Final   Special Requests IN PEDIATRIC BOTTLE Blood Culture adequate volume  Final   Culture NO GROWTH < 24 HOURS  Final   Report Status PENDING  Incomplete  MRSA PCR Screening     Status: None   Collection Time: 06/19/17 12:24 AM  Result Value Ref Range Status   MRSA by PCR NEGATIVE NEGATIVE Final    Comment:        The GeneXpert MRSA Assay (FDA approved for NASAL specimens only), is one component of a comprehensive MRSA colonization surveillance program. It is not intended to diagnose MRSA infection nor to guide or monitor treatment for MRSA infections.       Radiology Studies: Dg Chest 2 View  Result Date: 06/18/2017 CLINICAL DATA:  Weakness starting about a week ago, fever, dementia. EXAM: CHEST  2 VIEW COMPARISON:  Chest x-ray dated 03/12/2016. FINDINGS: Study is hypoinspiratory. Given the low lung volumes, lungs appear clear. No confluent opacity to suggest a developing pneumonia. No pleural effusions seen. Heart size and mediastinal contours are within normal limits. No acute or suspicious osseous finding. IMPRESSION: No active  cardiopulmonary disease. No evidence of pneumonia or pulmonary edema. Electronically Signed   By: Bary Richard M.D.   On: 06/18/2017 16:21   Ct Head Wo Contrast  Result Date: 06/18/2017 CLINICAL DATA:  Worsening confusion today, unsteady gait. History of severe dementia, brain injury. EXAM: CT HEAD WITHOUT CONTRAST TECHNIQUE: Contiguous axial images were obtained from the base of the skull through the vertex without intravenous contrast. COMPARISON:  CT HEAD Mar 12, 2016 FINDINGS: BRAIN: No intraparenchymal hemorrhage, mass effect nor midline shift. 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 Numerous old basal ganglia and thalamus infarcts with ex vacuo dilatation frontal horns and third ventricle. Moderate to severe parenchymal brain volume loss, advanced for age. Old RIGHT frontal lobe convexity infarct. Patchy supratentorial white matter hypodensities. No midline shift or mass effect. No abnormal extra-axial fluid collections. Basal cisterns are patent. VASCULAR: Moderate calcific atherosclerosis of the carotid siphons. SKULL: No skull fracture. Old distal LEFT nasal bone fracture. A No significant scalp soft tissue swelling. SINUSES/ORBITS: Minimal LEFT mastoid effusion. Paranasal sinus are well aerated. The included ocular globes and orbital contents are non-suspicious. OTHER: None. IMPRESSION: 1. No acute intracranial process. 2. 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 3. Moderate to severe parenchymal brain volume loss, advanced for age. Electronically Signed   By: Awilda Metro M.D.   On: 06/18/2017 21:34    Scheduled Meds: . aspirin EC  81 mg Oral Daily  . atorvastatin  10 mg Oral q1800  . cholecalciferol  2,000 Units Oral Daily  . divalproex  750 mg Oral QHS  . enoxaparin (LOVENOX) injection  40 mg Subcutaneous Q24H  . levothyroxine  25 mcg Oral QAC breakfast  . metoprolol  succinate  25 mg Oral BID   Continuous Infusions: . sodium chloride    . sodium chloride 100 mL/hr at 06/19/17 1227  . piperacillin-tazobactam (ZOSYN)  IV Stopped (06/19/17 0909)  . vancomycin 500 mg (06/19/17 1228)     LOS: 1 day   Time spent: 25 minutes.  Hazeline Junker, MD [facility] Pager [phone]  If 7PM-7AM, please contact night-coverage [email or web address] Password [email or web address] 06/19/2017, 1:57 PM

## 2017-06-20 ENCOUNTER — Encounter (HOSPITAL_COMMUNITY): Payer: Self-pay | Admitting: General Practice

## 2017-06-20 DIAGNOSIS — E870 Hyperosmolality and hypernatremia: Secondary | ICD-10-CM

## 2017-06-20 DIAGNOSIS — N179 Acute kidney failure, unspecified: Secondary | ICD-10-CM

## 2017-06-20 DIAGNOSIS — I10 Essential (primary) hypertension: Secondary | ICD-10-CM

## 2017-06-20 DIAGNOSIS — R000 Tachycardia, unspecified: Secondary | ICD-10-CM

## 2017-06-20 DIAGNOSIS — A419 Sepsis, unspecified organism: Secondary | ICD-10-CM

## 2017-06-20 DIAGNOSIS — F0280 Dementia in other diseases classified elsewhere without behavioral disturbance: Secondary | ICD-10-CM

## 2017-06-20 LAB — BASIC METABOLIC PANEL
ANION GAP: 9 (ref 5–15)
BUN: 22 mg/dL — ABNORMAL HIGH (ref 6–20)
CALCIUM: 8.1 mg/dL — AB (ref 8.9–10.3)
CO2: 28 mmol/L (ref 22–32)
CREATININE: 0.93 mg/dL (ref 0.61–1.24)
Chloride: 110 mmol/L (ref 101–111)
GLUCOSE: 115 mg/dL — AB (ref 65–99)
Potassium: 3.7 mmol/L (ref 3.5–5.1)
Sodium: 147 mmol/L — ABNORMAL HIGH (ref 135–145)

## 2017-06-20 LAB — HEMOGLOBIN A1C
Hgb A1c MFr Bld: 5.9 % — ABNORMAL HIGH (ref 4.8–5.6)
Mean Plasma Glucose: 122.63 mg/dL

## 2017-06-20 MED ORDER — PNEUMOCOCCAL VAC POLYVALENT 25 MCG/0.5ML IJ INJ
0.5000 mL | INJECTION | INTRAMUSCULAR | Status: AC
  Administered 2017-06-21: 0.5 mL via INTRAMUSCULAR
  Filled 2017-06-20 (×2): qty 0.5

## 2017-06-20 MED ORDER — DEXTROSE 5 % IV SOLN
INTRAVENOUS | Status: DC
  Administered 2017-06-20 (×2): via INTRAVENOUS

## 2017-06-20 NOTE — Progress Notes (Signed)
[facility]  Osborne Cascoadia Alsha Meland LCSWA [phone]

## 2017-06-20 NOTE — Progress Notes (Signed)
[facility]                                                                              Patient Demographics  Donald Montoya, is a 56 y.o. male, DOB - [birth year], ZOX:[id number]  Admit date - 06/18/2017   Admitting Physician No admitting provider for patient encounter.  Outpatient Primary MD for the patient is [name], MD  Outpatient specialists:   LOS - 2  days   Medical records reviewed and are as summarized below:    Chief Complaint  Patient presents with  . Weakness       Brief summary   Donald Montoya is a 56 y.o. male with a history of advanced dementia, possible history of brain injury, hypothyroidism, and HTN who was sent from memory care facility for change in mental status. He became tachycardic, hypertensive, and responded more slowly to questions than usual, per report and had decreased per oral intake for about 24 hours prior to arrival. He was also more unsteady with possible left facial droop and leaning to the left per staff. On arrival to ED temperature was 99.3F, sinus tachycardia in 110's, and hypotensive to 99/76. WBC modestly elevated to 11 and sodium noted to be acute up to 157. BUN 36 and creatinine 1.51 up from baseline. Lactate initially elevated to 2.71. Early sepsis was suspected, though UA and CXR showed no evidence of infection. 2L NS were administered, vancomycin and zosyn started, and the patient was admitted.   Assessment & Plan    Principal Problem:  SIRS - Possible SIRS, based on tachycardia and mild leukocytosis,sepsis ruled out - blood cultures negative so far, UA negative for UTI   Active Problems: Acute metabolic encephalopathy in the setting of advanced dementia - Possibly due to acute hypernatremia, CT head showed no acute abnormalities - TSH, B12 normal, HIVnegative - much more alert and awake today    Hypernatremia - likely due to dehydration, urine osmolality elevated, not consistent with DI, no  diuretics - continue D5 drip, sodium improving  Acute kidney injury - creatinine1.5 at the time of admission, baseline 1.1 - Improving, continue IV fluid hydration  Advanced dementia - continue Depakote - Hold Zoloft, Seroquel due to prolonged QT    Hypertension - stable, continue metoprolol  Hyperlipidemia - Continue statin  Hypothyroidism - TSH 1.0, continue Synthroid  Hyperglycemia on admission - hemoglobin A1c pending  Code Status: full  DVT Prophylaxis:  Lovenox Family Communication: Discussed in detail with the patient, all imaging results, lab results explained to the patient   Disposition Plan:   Time Spent in minutes  25 minutes  Procedures:   Consultants:     Antimicrobials:      Medications  Scheduled Meds: . aspirin EC  81 mg Oral Daily  . atorvastatin  10 mg Oral q1800  . cholecalciferol  2,000 Units Oral Daily  . divalproex  750 mg Oral QHS  . enoxaparin (LOVENOX) injection  40 mg Subcutaneous Q24H  . levothyroxine  25 mcg Oral QAC breakfast  . mouth rinse  15 mL Mouth Rinse BID  . metoprolol succinate  25 mg Oral BID  Continuous Infusions: . dextrose    . piperacillin-tazobactam (ZOSYN)  IV Stopped (06/20/17 1009)  . vancomycin Stopped (06/20/17 0044)   PRN Meds:.acetaminophen **OR** acetaminophen, alum & mag hydroxide-simeth, guaifenesin, labetalol, loperamide, magnesium hydroxide, ondansetron **OR** ondansetron (ZOFRAN) IV, TRIPLE ANTIBIOTIC   Antibiotics   Anti-infectives    Start     Dose/Rate Route Frequency Ordered Stop   06/19/17 1200  vancomycin (VANCOCIN) 500 mg in sodium chloride 0.9 % 100 mL IVPB     500 mg 100 mL/hr over 60 Minutes Intravenous Every 12 hours 06/18/17 2331     06/19/17 0630  vancomycin (VANCOCIN) 500 mg in sodium chloride 0.9 % 100 mL IVPB  Status:  Discontinued     500 mg 100 mL/hr over 60 Minutes Intravenous Every 12 hours 06/18/17 1838 06/18/17 2331   06/19/17 0200  piperacillin-tazobactam (ZOSYN)  IVPB 3.375 g     3.375 g 12.5 mL/hr over 240 Minutes Intravenous Every 8 hours 06/18/17 1822     06/19/17 0000  vancomycin (VANCOCIN) 1,500 mg in sodium chloride 0.9 % 500 mL IVPB     1,500 mg 250 mL/hr over 120 Minutes Intravenous  Once 06/18/17 2331 06/19/17 0254   06/18/17 2200  vancomycin (VANCOCIN) 500 mg in sodium chloride 0.9 % 100 mL IVPB  Status:  Discontinued     500 mg 100 mL/hr over 60 Minutes Intravenous Every 12 hours 06/18/17 1822 06/18/17 1838   06/18/17 1830  vancomycin (VANCOCIN) 1,500 mg in sodium chloride 0.9 % 500 mL IVPB  Status:  Discontinued     1,500 mg 250 mL/hr over 120 Minutes Intravenous  Once 06/18/17 1822 06/18/17 2331   06/18/17 1830  piperacillin-tazobactam (ZOSYN) IVPB 3.375 g     3.375 g 100 mL/hr over 30 Minutes Intravenous  Once 06/18/17 1822 06/18/17 2329        Subjective:   Donald Montoya was seen and examined today. No new complaints,alert and awake. Patient denies dizziness, chest pain, shortness of breath, abdominal pain, N/V/D/C, new weakness, numbess, tingling. No acute events overnight.    Objective:   Vitals:   06/19/17 1539 06/19/17 2234 06/20/17 0606 06/20/17 0615  BP: (!) 148/83 (!) 148/78 127/67   Pulse: 73 73 (!) 51 64  Resp: 18 18 18    Temp: 98.2 F (36.8 C) 97.9 F (36.6 C) (!) 97.4 F (36.3 C) 98.1 F (36.7 C)  TempSrc: Oral Oral Oral Oral  SpO2: 94% 94% 96%   Weight:      Height:        Intake/Output Summary (Last 24 hours) at 06/20/17 1153 Last data filed at 06/20/17 0900  Gross per 24 hour  Intake          1875.83 ml  Output              800 ml  Net          1075.83 ml     Wt Readings from Last 3 Encounters:  06/18/17 67.1 kg (148 lb)  10/13/15 67.2 kg (148 lb 2.4 oz)     Exam  General: Alert and oriented x 2  Eyes:   HEENT:  Atraumatic, normocephalic  Cardiovascular: S1 S2 auscultated, no rubs, murmurs or gallops. Regular rate and rhythm.  Respiratory: Clear to auscultation bilaterally, no  wheezing, rales or rhonchi  Gastrointestinal: Soft, nontender, nondistended, + bowel sounds  Ext: no pedal edema bilaterally  Neuro: moving all 4 extremities  Musculoskeletal: No digital cyanosis, clubbing  Skin: No rashes  Psych:  alert and oriented x2    Data Reviewed:  I have personally reviewed following labs and imaging studies  Micro Results Recent Results (from the past 240 hour(s))  Culture, blood (routine x 2)     Status: None (Preliminary result)   Collection Time: 06/18/17  2:37 PM  Result Value Ref Range Status   Specimen Description BLOOD LEFT ANTECUBITAL  Final   Special Requests   Final    BOTTLES DRAWN AEROBIC AND ANAEROBIC Blood Culture adequate volume   Culture NO GROWTH 2 DAYS  Final   Report Status PENDING  Incomplete  Culture, blood (routine x 2)     Status: None (Preliminary result)   Collection Time: 06/18/17  3:20 PM  Result Value Ref Range Status   Specimen Description BLOOD RIGHT ANTECUBITAL  Final   Special Requests IN PEDIATRIC BOTTLE Blood Culture adequate volume  Final   Culture NO GROWTH 2 DAYS  Final   Report Status PENDING  Incomplete  Urine culture     Status: None   Collection Time: 06/18/17  6:08 PM  Result Value Ref Range Status   Specimen Description URINE, CATHETERIZED  Final   Special Requests NONE  Final   Culture NO GROWTH  Final   Report Status 06/19/2017 FINAL  Final  MRSA PCR Screening     Status: None   Collection Time: 06/19/17 12:24 AM  Result Value Ref Range Status   MRSA by PCR NEGATIVE NEGATIVE Final    Comment:        The GeneXpert MRSA Assay (FDA approved for NASAL specimens only), is one component of a comprehensive MRSA colonization surveillance program. It is not intended to diagnose MRSA infection nor to guide or monitor treatment for MRSA infections.     Radiology Reports Dg Chest 2 View  Result Date: 06/18/2017 CLINICAL DATA:  Weakness starting about a week ago, fever, dementia. EXAM: CHEST  2 VIEW  COMPARISON:  Chest x-ray dated 03/12/2016. FINDINGS: Study is hypoinspiratory. Given the low lung volumes, lungs appear clear. No confluent opacity to suggest a developing pneumonia. No pleural effusions seen. Heart size and mediastinal contours are within normal limits. No acute or suspicious osseous finding. IMPRESSION: No active cardiopulmonary disease. No evidence of pneumonia or pulmonary edema. Electronically Signed   By: Bary Richard M.D.   On: 06/18/2017 16:21   Ct Head Wo Contrast  Result Date: 06/18/2017 CLINICAL DATA:  Worsening confusion today, unsteady gait. History of severe dementia, brain injury. EXAM: CT HEAD WITHOUT CONTRAST TECHNIQUE: Contiguous axial images were obtained from the base of the skull through the vertex without intravenous contrast. COMPARISON:  CT HEAD Mar 12, 2016 FINDINGS: BRAIN: No intraparenchymal hemorrhage, mass effect nor midline shift. 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 Numerous old basal ganglia and thalamus infarcts with ex vacuo dilatation frontal horns and third ventricle. Moderate to severe parenchymal brain volume loss, advanced for age. Old RIGHT frontal lobe convexity infarct. Patchy supratentorial white matter hypodensities. No midline shift or mass effect. No abnormal extra-axial fluid collections. Basal cisterns are patent. VASCULAR: Moderate calcific atherosclerosis of the carotid siphons. SKULL: No skull fracture. Old distal LEFT nasal bone fracture. A No significant scalp soft tissue swelling. SINUSES/ORBITS: Minimal LEFT mastoid effusion. Paranasal sinus are well aerated. The included ocular globes and orbital contents are non-suspicious. OTHER: None. IMPRESSION: 1. No acute intracranial process. 2. 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 3. Moderate  to severe parenchymal brain volume loss, advanced for age. Electronically Signed    By: Awilda Metro M.D.   On: 06/18/2017 21:34    Lab Data:  CBC:  Recent Labs Lab 06/18/17 1437 06/18/17 1930  WBC 11.0* 11.2*  HGB 15.6 14.1  HCT 49.6 45.2  MCV 92.5 92.8  PLT 198 167   Basic Metabolic Panel:  Recent Labs Lab 06/18/17 1437 06/18/17 1930 06/19/17 0304 06/19/17 1434 06/20/17 0503  NA 157*  --  157* 151* 147*  K 4.3  --  4.0 3.7 3.7  CL 118*  --  120* 118* 110  CO2 26  --  29 28 28   GLUCOSE 214*  --  103* 136* 115*  BUN 36*  --  27* 26* 22*  CREATININE 1.51* 1.16 1.12 1.07 0.93  CALCIUM 9.3  --  8.2* 8.1* 8.1*   GFR: Estimated Creatinine Clearance: 83.2 mL/min (by C-G formula based on SCr of 0.93 mg/dL). Liver Function Tests:  Recent Labs Lab 06/18/17 1437  AST 45*  ALT 20  ALKPHOS 55  BILITOT 1.1  PROT 6.1*  ALBUMIN 3.2*   No results for input(s): LIPASE, AMYLASE in the last 168 hours.  Recent Labs Lab 06/19/17 1434  AMMONIA 37*   Coagulation Profile:  Recent Labs Lab 06/18/17 1930  INR 1.15   Cardiac Enzymes: No results for input(s): CKTOTAL, CKMB, CKMBINDEX, TROPONINI in the last 168 hours. BNP (last 3 results) No results for input(s): PROBNP in the last 8760 hours. HbA1C: No results for input(s): HGBA1C in the last 72 hours. CBG: No results for input(s): GLUCAP in the last 168 hours. Lipid Profile: No results for input(s): CHOL, HDL, LDLCALC, TRIG, CHOLHDL, LDLDIRECT in the last 72 hours. Thyroid Function Tests:  Recent Labs  06/18/17 1848  TSH 1.088   Anemia Panel:  Recent Labs  06/18/17 1848  VITAMINB12 399   Urine analysis:    Component Value Date/Time   COLORURINE YELLOW 06/18/2017 1808   APPEARANCEUR CLEAR 06/18/2017 1808   LABSPEC 1.024 06/18/2017 1808   PHURINE 5.0 06/18/2017 1808   GLUCOSEU NEGATIVE 06/18/2017 1808   HGBUR NEGATIVE 06/18/2017 1808   BILIRUBINUR NEGATIVE 06/18/2017 1808   KETONESUR NEGATIVE 06/18/2017 1808   PROTEINUR NEGATIVE 06/18/2017 1808   UROBILINOGEN 1.0 08/27/2009  2303   NITRITE NEGATIVE 06/18/2017 1808   LEUKOCYTESUR NEGATIVE 06/18/2017 1808     Ripudeep Rai M.D. [facility] 06/20/2017, 11:53 AM  Pager: [phone] Between 7am to 7pm - call Pager - [phone]  After 7pm go to [email or web address] - password [email or web address]  Call night coverage person covering after 7pm

## 2017-06-20 NOTE — H&P (Signed)
PATIENT UNABLE TO FINISH HISTORY AT THIS TIME

## 2017-06-20 NOTE — Plan of Care (Signed)
Problem: Safety: Goal: Ability to remain free from injury will improve Outcome: Progressing Pt will be free from falls and injuries during this hospitalization.   

## 2017-06-21 LAB — BASIC METABOLIC PANEL
Anion gap: 7 (ref 5–15)
Anion gap: 7 (ref 5–15)
BUN: 14 mg/dL (ref 6–20)
BUN: 13 mg/dL (ref 6–20)
CHLORIDE: 108 mmol/L (ref 101–111)
CHLORIDE: 94 mmol/L — AB (ref 101–111)
CO2: 29 mmol/L (ref 22–32)
CO2: 27 mmol/L (ref 22–32)
CREATININE: 0.89 mg/dL (ref 0.61–1.24)
CREATININE: 0.89 mg/dL (ref 0.61–1.24)
Calcium: 8.4 mg/dL — ABNORMAL LOW (ref 8.9–10.3)
Calcium: 8 mg/dL — ABNORMAL LOW (ref 8.9–10.3)
GFR calc Af Amer: >60 mL/min (ref >60)
GFR calc non Af Amer: >60 mL/min (ref >60)
GFR calc non Af Amer: >60 mL/min (ref >60)
Glucose, Bld: 95 mg/dL (ref 65–99)
Glucose, Bld: 485 mg/dL — ABNORMAL HIGH (ref 65–99)
POTASSIUM: 3.6 mmol/L (ref 3.5–5.1)
POTASSIUM: 3.5 mmol/L (ref 3.5–5.1)
Sodium: 144 mmol/L (ref 135–145)
Sodium: 128 mmol/L — ABNORMAL LOW (ref 135–145)

## 2017-06-21 MED ORDER — DEXTROSE 5 % IV SOLN
INTRAVENOUS | Status: DC
  Administered 2017-06-21: 1000 mL via INTRAVENOUS
  Administered 2017-06-21: 09:00:00 via INTRAVENOUS

## 2017-06-21 NOTE — Progress Notes (Signed)
[facility]                                                                              Patient Demographics  [name], is a 56 y.o. male, DOB - August 15, 1961, ONG:[id number]  Admit date - 06/18/2017   Admitting Physician No admitting provider for patient encounter.  Outpatient Primary MD for the patient is [name], MD  Outpatient specialists:   LOS - 3  days   Medical records reviewed and are as summarized below:    Chief Complaint  Patient presents with  . Weakness       Brief summary   Donald Montoya is a 56 y.o. male with a history of advanced dementia, possible history of brain injury, hypothyroidism, and HTN who was sent from memory care facility for change in mental status. He became tachycardic, hypertensive, and responded more slowly to questions than usual, per report and had decreased per oral intake for about 24 hours prior to arrival. He was also more unsteady with possible left facial droop and leaning to the left per staff. On arrival to ED temperature was 99.67F, sinus tachycardia in 110's, and hypotensive to 99/76. WBC modestly elevated to 11 and sodium noted to be acute up to 157. BUN 36 and creatinine 1.51 up from baseline. Lactate initially elevated to 2.71. Early sepsis was suspected, though UA and CXR showed no evidence of infection. 2L NS were administered, vancomycin and zosyn started, and the patient was admitted.   Assessment & Plan    Principal Problem:  SIRS - Possible SIRS, based on tachycardia and mild leukocytosis,sepsis ruled out - blood cultures negative so far, UA negative for UTI - Will discontinue IV antibiotics, no fevers overnight, observe off antibiotics   Active Problems: Acute metabolic encephalopathy in the setting of advanced dementia - Possibly due to acute hypernatremia, CT head showed no acute abnormalities - TSH, B12 normal, HIVnegative - alert and awake today, eating breakfast by himself   Hypernatremia - likely due to dehydration, urine osmolality elevated, not consistent with DI, no diuretics - continue D5 drip, overnight was stopped, restarted the D5 drip, recheck labs at 2 PM today  Acute kidney injury - creatinine1.5 at the time of admission, baseline 1.1 - Improving, continue IV fluid hydration  Advanced dementia - continue Depakote - Hold Zoloft, Seroquel due to prolonged QTc 507    Hypertension - stable, continue metoprolol  Hyperlipidemia - Continue statin  Hypothyroidism - TSH 1.0, continue Synthroid  Hyperglycemia on admission - hemoglobin A1c 5.9, diet control  Code Status: full  DVT Prophylaxis:  Lovenox Family Communication: Discussed in detail with the patient, all imaging results, lab results explained to the patient   Disposition Plan: awaiting labs for today  Time Spent in minutes  25 minutes  Procedures:   Consultants:     Antimicrobials:      Medications  Scheduled Meds: . aspirin EC  81 mg Oral Daily  . atorvastatin  10 mg Oral q1800  . cholecalciferol  2,000 Units Oral Daily  . divalproex  750 mg Oral QHS  . enoxaparin (LOVENOX) injection  40 mg Subcutaneous Q24H  .  levothyroxine  25 mcg Oral QAC breakfast  . mouth rinse  15 mL Mouth Rinse BID  . metoprolol succinate  25 mg Oral BID   Continuous Infusions: . dextrose 150 mL/hr at 06/21/17 0843   PRN Meds:.acetaminophen **OR** acetaminophen, alum & mag hydroxide-simeth, guaifenesin, labetalol, loperamide, magnesium hydroxide, ondansetron **OR** ondansetron (ZOFRAN) IV, TRIPLE ANTIBIOTIC   Antibiotics   Anti-infectives    Start     Dose/Rate Route Frequency Ordered Stop   06/19/17 1200  vancomycin (VANCOCIN) 500 mg in sodium chloride 0.9 % 100 mL IVPB  Status:  Discontinued     500 mg 100 mL/hr over 60 Minutes Intravenous Every 12 hours 06/18/17 2331 06/21/17 0833   06/19/17 0630  vancomycin (VANCOCIN) 500 mg in sodium chloride 0.9 % 100 mL IVPB  Status:   Discontinued     500 mg 100 mL/hr over 60 Minutes Intravenous Every 12 hours 06/18/17 1838 06/18/17 2331   06/19/17 0200  piperacillin-tazobactam (ZOSYN) IVPB 3.375 g  Status:  Discontinued     3.375 g 12.5 mL/hr over 240 Minutes Intravenous Every 8 hours 06/18/17 1822 06/21/17 0833   06/19/17 0000  vancomycin (VANCOCIN) 1,500 mg in sodium chloride 0.9 % 500 mL IVPB     1,500 mg 250 mL/hr over 120 Minutes Intravenous  Once 06/18/17 2331 06/19/17 0254   06/18/17 2200  vancomycin (VANCOCIN) 500 mg in sodium chloride 0.9 % 100 mL IVPB  Status:  Discontinued     500 mg 100 mL/hr over 60 Minutes Intravenous Every 12 hours 06/18/17 1822 06/18/17 1838   06/18/17 1830  vancomycin (VANCOCIN) 1,500 mg in sodium chloride 0.9 % 500 mL IVPB  Status:  Discontinued     1,500 mg 250 mL/hr over 120 Minutes Intravenous  Once 06/18/17 1822 06/18/17 2331   06/18/17 1830  piperacillin-tazobactam (ZOSYN) IVPB 3.375 g     3.375 g 100 mL/hr over 30 Minutes Intravenous  Once 06/18/17 1822 06/18/17 2329        Subjective:   Donald Montoya was seen and examined today. Eating breakfast without any difficulty, much more alert and awake, denies any specific complaints. Patient denies dizziness, chest pain, shortness of breath, abdominal pain, N/V/D/C, new weakness, numbess, tingling. No acute events overnight.    Objective:   Vitals:   06/20/17 0615 06/20/17 1502 06/20/17 2117 06/21/17 0540  BP:  126/77 127/76 123/77  Pulse: 64 60 62 65  Resp:  16 18 18   Temp: 98.1 F (36.7 C) 97.8 F (36.6 C) 98.1 F (36.7 C) 98 F (36.7 C)  TempSrc: Oral Oral Oral Oral  SpO2:  97% 97% 96%  Weight:      Height:        Intake/Output Summary (Last 24 hours) at 06/21/17 1226 Last data filed at 06/21/17 0938  Gross per 24 hour  Intake             1000 ml  Output             1100 ml  Net             -100 ml     Wt Readings from Last 3 Encounters:  06/18/17 67.1 kg (148 lb)  10/13/15 67.2 kg (148 lb 2.4 oz)      Exam General: Alert and oriented x 2 Eyes:  HEENT:  Atraumatic, normocephalic Cardiovascular: S1 S2 auscultated, no rubs, murmurs or gallops. Regular rate and rhythm. No pedal edema b/l Respiratory: Clear to auscultation bilaterally, no wheezing, rales or rhonchi  Gastrointestinal: Soft, nontender, nondistended, + bowel sounds Ext: no pedal edema bilaterally Neuro:no new deficits Musculoskeletal: No digital cyanosis, clubbing Skin: No rashes Psych: Normal affect and demeanor, alert and oriented x2     Data Reviewed:  I have personally reviewed following labs and imaging studies  Micro Results Recent Results (from the past 240 hour(s))  Culture, blood (routine x 2)     Status: None (Preliminary result)   Collection Time: 06/18/17  2:37 PM  Result Value Ref Range Status   Specimen Description BLOOD LEFT ANTECUBITAL  Final   Special Requests   Final    BOTTLES DRAWN AEROBIC AND ANAEROBIC Blood Culture adequate volume   Culture NO GROWTH 3 DAYS  Final   Report Status PENDING  Incomplete  Culture, blood (routine x 2)     Status: None (Preliminary result)   Collection Time: 06/18/17  3:20 PM  Result Value Ref Range Status   Specimen Description BLOOD RIGHT ANTECUBITAL  Final   Special Requests IN PEDIATRIC BOTTLE Blood Culture adequate volume  Final   Culture NO GROWTH 3 DAYS  Final   Report Status PENDING  Incomplete  Urine culture     Status: None   Collection Time: 06/18/17  6:08 PM  Result Value Ref Range Status   Specimen Description URINE, CATHETERIZED  Final   Special Requests NONE  Final   Culture NO GROWTH  Final   Report Status 06/19/2017 FINAL  Final  MRSA PCR Screening     Status: None   Collection Time: 06/19/17 12:24 AM  Result Value Ref Range Status   MRSA by PCR NEGATIVE NEGATIVE Final    Comment:        The GeneXpert MRSA Assay (FDA approved for NASAL specimens only), is one component of a comprehensive MRSA colonization surveillance program. It is  not intended to diagnose MRSA infection nor to guide or monitor treatment for MRSA infections.     Radiology Reports Dg Chest 2 View  Result Date: 06/18/2017 CLINICAL DATA:  Weakness starting about a week ago, fever, dementia. EXAM: CHEST  2 VIEW COMPARISON:  Chest x-ray dated 03/12/2016. FINDINGS: Study is hypoinspiratory. Given the low lung volumes, lungs appear clear. No confluent opacity to suggest a developing pneumonia. No pleural effusions seen. Heart size and mediastinal contours are within normal limits. No acute or suspicious osseous finding. IMPRESSION: No active cardiopulmonary disease. No evidence of pneumonia or pulmonary edema. Electronically Signed   By: Bary Richard M.D.   On: 06/18/2017 16:21   Ct Head Wo Contrast  Result Date: 06/18/2017 CLINICAL DATA:  Worsening confusion today, unsteady gait. History of severe dementia, brain injury. EXAM: CT HEAD WITHOUT CONTRAST TECHNIQUE: Contiguous axial images were obtained from the base of the skull through the vertex without intravenous contrast. COMPARISON:  CT HEAD Mar 12, 2016 FINDINGS: BRAIN: No intraparenchymal hemorrhage, mass effect nor midline shift. 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 Numerous old basal ganglia and thalamus infarcts with ex vacuo dilatation frontal horns and third ventricle. Moderate to severe parenchymal brain volume loss, advanced for age. Old RIGHT frontal lobe convexity infarct. Patchy supratentorial white matter hypodensities. No midline shift or mass effect. No abnormal extra-axial fluid collections. Basal cisterns are patent. VASCULAR: Moderate calcific atherosclerosis of the carotid siphons. SKULL: No skull fracture. Old distal LEFT nasal bone fracture. A No significant scalp soft tissue swelling. SINUSES/ORBITS: Minimal LEFT mastoid effusion. Paranasal sinus are well aerated. The included ocular globes and orbital contents are non-suspicious. OTHER: None. IMPRESSION:  1. No acute intracranial  process. 2. 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 3. Moderate to severe parenchymal brain volume loss, advanced for age. Electronically Signed   By: Awilda Metroourtnay  Bloomer M.D.   On: 06/18/2017 21:34    Lab Data:  CBC:  Recent Labs Lab 06/18/17 1437 06/18/17 1930  WBC 11.0* 11.2*  HGB 15.6 14.1  HCT 49.6 45.2  MCV 92.5 92.8  PLT 198 167   Basic Metabolic Panel:  Recent Labs Lab 06/18/17 1437 06/18/17 1930 06/19/17 0304 06/19/17 1434 06/20/17 0503 06/21/17 0502  NA 157*  --  157* 151* 147* 144  K 4.3  --  4.0 3.7 3.7 3.6  CL 118*  --  120* 118* 110 108  CO2 26  --  29 28 28 29   GLUCOSE 214*  --  103* 136* 115* 95  BUN 36*  --  27* 26* 22* 14  CREATININE 1.51* 1.16 1.12 1.07 0.93 0.89  CALCIUM 9.3  --  8.2* 8.1* 8.1* 8.4*   GFR: Estimated Creatinine Clearance: 86.9 mL/min (by C-G formula based on SCr of 0.89 mg/dL). Liver Function Tests:  Recent Labs Lab 06/18/17 1437  AST 45*  ALT 20  ALKPHOS 55  BILITOT 1.1  PROT 6.1*  ALBUMIN 3.2*   No results for input(s): LIPASE, AMYLASE in the last 168 hours.  Recent Labs Lab 06/19/17 1434  AMMONIA 37*   Coagulation Profile:  Recent Labs Lab 06/18/17 1930  INR 1.15   Cardiac Enzymes: No results for input(s): CKTOTAL, CKMB, CKMBINDEX, TROPONINI in the last 168 hours. BNP (last 3 results) No results for input(s): PROBNP in the last 8760 hours. HbA1C:  Recent Labs  06/20/17 0503  HGBA1C 5.9*   CBG: No results for input(s): GLUCAP in the last 168 hours. Lipid Profile: No results for input(s): CHOL, HDL, LDLCALC, TRIG, CHOLHDL, LDLDIRECT in the last 72 hours. Thyroid Function Tests:  Recent Labs  06/18/17 1848  TSH 1.088   Anemia Panel:  Recent Labs  06/18/17 1848  VITAMINB12 399   Urine analysis:    Component Value Date/Time   COLORURINE YELLOW 06/18/2017 1808   APPEARANCEUR CLEAR  06/18/2017 1808   LABSPEC 1.024 06/18/2017 1808   PHURINE 5.0 06/18/2017 1808   GLUCOSEU NEGATIVE 06/18/2017 1808   HGBUR NEGATIVE 06/18/2017 1808   BILIRUBINUR NEGATIVE 06/18/2017 1808   KETONESUR NEGATIVE 06/18/2017 1808   PROTEINUR NEGATIVE 06/18/2017 1808   UROBILINOGEN 1.0 08/27/2009 2303   NITRITE NEGATIVE 06/18/2017 1808   LEUKOCYTESUR NEGATIVE 06/18/2017 1808     Ripudeep Rai M.D. [facility] 06/21/2017, 12:26 PM  Pager: [phone] Between 7am to 7pm - call Pager - [phone]  After 7pm go to [email or web address] - password [email or web address]  Call night coverage person covering after 7pm

## 2017-06-21 NOTE — Progress Notes (Signed)
Physical Therapy Treatment Patient Details Name: Donald Montoya MRN: [id number] DOB: [birth year] Today's Date: 06/21/2017    History of Present Illness Donald Montoya  is a 56 y.o. male, With history of advanced dementia from? Brain injury, hypothyroidism and hypertension who is a resident of a memory care facility who was brought to the ED by EMS for acute change in mental status since this morning.    PT Comments    Pt participating well and progressing gait with RW.   Follow Up Recommendations  No PT follow up;Other (comment)     Equipment Recommendations  Other (comment);None recommended by PT    Recommendations for Other Services       Precautions / Restrictions Precautions Precautions: Fall Restrictions Weight Bearing Restrictions: No    Mobility  Bed Mobility Overal bed mobility: Needs Assistance       Supine to sit: Min assist     General bed mobility comments: assisted up and forward, pt scooted once up.  Transfers Overall transfer level: Needs assistance Equipment used: Rolling walker (2 wheeled) (iv pole vs) Transfers: Sit to/from Stand Sit to Stand: Min assist         General transfer comment: min for stability and helping come forward.  Ambulation/Gait Ambulation/Gait assistance: Min assist Ambulation Distance (Feet): 130 Feet Assistive device: Rolling walker (2 wheeled) Gait Pattern/deviations: Step-through pattern   Gait velocity interpretation: Below normal speed for age/gender General Gait Details: ataxic more on the left.  L contact up on forefoot with no heel contact unless in stance.   Stairs            Wheelchair Mobility    Modified Rankin (Stroke Patients Only)       Balance Overall balance assessment: Needs assistance Sitting-balance support: Single extremity supported;Bilateral upper extremity supported Sitting balance-Leahy Scale: Fair       Standing balance-Leahy Scale: Poor Standing balance comment:  tends to list posteriorly needing external support                            Cognition Arousal/Alertness: Awake/alert Behavior During Therapy: Restless;WFL for tasks assessed/performed Overall Cognitive Status: History of cognitive impairments - at baseline                                 General Comments: following simple direction with supplementary cues      Exercises      General Comments        Pertinent Vitals/Pain Pain Assessment: No/denies pain    Home Living                      Prior Function            PT Goals (current goals can now be found in the care plan section) Acute Rehab PT Goals Patient Stated Goal: pt unable to participate in goal setting PT Goal Formulation: Patient unable to participate in goal setting Time For Goal Achievement: 06/26/17 Potential to Achieve Goals: Fair Progress towards PT goals: Progressing toward goals    Frequency    Min 2X/week      PT Plan Current plan remains appropriate    Co-evaluation              AM-PAC PT "6 Clicks" Daily Activity  Outcome Measure  Difficulty turning over in bed (including adjusting bedclothes, sheets and blankets)?: A Lot  Difficulty sitting down on and standing up from a chair with arms (e.g., wheelchair, bedside commode, etc,.)?: Unable Help needed moving to and from a bed to chair (including a wheelchair)?: A Little Help needed walking in hospital room?: A Little Help needed climbing 3-5 steps with a railing? : A Lot 6 Click Score: 11    End of Session   Activity Tolerance: Patient tolerated treatment well Patient left: in chair;with call bell/phone within reach;with chair alarm set Nurse Communication: Mobility status PT Visit Diagnosis: Unsteadiness on feet (R26.81);Other abnormalities of gait and mobility (R26.89)     Time: [id number] PT Time Calculation (min) (ACUTE ONLY): 32 min  Charges:  $Gait Training: 8-22 mins $Therapeutic  Activity: 8-22 mins                    G Codes:       06/21/2017  Anthem BingKen Elzy Tomasello, PT [phone]  (pager)   Eliseo GumKenneth V Fannye Myer 06/21/2017, 11:18 AM

## 2017-06-21 NOTE — Plan of Care (Signed)
Problem: Safety: Goal: Ability to remain free from injury will improve Outcome: Progressing No safety issues noted  Problem: Pain Managment: Goal: General experience of comfort will improve Outcome: Progressing Denies pain  Problem: Skin Integrity: Goal: Risk for impaired skin integrity will decrease Outcome: Progressing No skin issues noted, skin care performed  Problem: Tissue Perfusion: Goal: Risk factors for ineffective tissue perfusion will decrease Outcome: Progressing No signs of DVT noted  Problem: Activity: Goal: Risk for activity intolerance will decrease Outcome: Progressing Tolerates repositioning well

## 2017-06-21 NOTE — Clinical Social Work Note (Signed)
Clinical Social Work Assessment  Patient Details  Name: Donald DikesBobby D Heatley MRN: [id number] Date of Birth: [birth year]  Date of referral:  06/21/17               Reason for consult:  Discharge Planning                Permission sought to share information with:  Oceanographeracility Contact Representative Permission granted to share information::  Yes, Verbal Permission Granted  Name::        Agency::  [facility]  Relationship::     Contact Information:     Housing/Transportation Living arrangements for the past 2 months:  [facility] Source of Information:  Patient, Facility Patient Interpreter Needed:  None Criminal Activity/Legal Involvement Pertinent to Current Situation/Hospitalization:  No - Comment as needed Significant Relationships:  None Lives with:  Facility Resident Do you feel safe going back to the place where you live?  Yes Need for family participation in patient care:  No (Coment) (No family available)  Care giving concerns:  CSW received consult regarding discharge planning. [facility]. Patient is disoriented but did tell CSW that he wants to return to where he lives. Patient has no family contacts per facility and number on facesheet does not work. CSW to continue to follow and assist with discharge planning needs.   Social Worker assessment / plan:  CS[facility] spoke with [facility] and [facility] about returning at discharge.  Employment status:  Disabled (Comment on whether or not currently receiving Disability) Insurance information:  [place] Recommendations:  No Follow Up Information / Referral to community resources:     Patient/Family's Response to care:  Patient would like to return home at discharge.   Patient/Family's Understanding of and Emotional Response to Diagnosis, Current Treatment, and Prognosis:  Patient/family is realistic regarding therapy needs and  expressed being hopeful for return to memory care. Patient expressed understanding of CSW role and discharge process but is disoriented with regards to his medical condition. No questions/concerns about plan or treatment.    Emotional Assessment Appearance:  Appears stated age Attitude/Demeanor/Rapport:  Unable to Assess Affect (typically observed):  Unable to Assess Orientation:  Oriented to Self Alcohol / Substance use:  Not Applicable Psych involvement (Current and /or in the community):  No (Comment)  Discharge Needs  Concerns to be addressed:  Care Coordination Readmission within the last 30 days:  No Current discharge risk:  None Barriers to Discharge:  Continued Medical Work up   In[employer] Incadia S Halford Goetzke, LCSWA 06/21/2017, 8:28 AM

## 2017-06-21 NOTE — Discharge Summary (Signed)
Physician Discharge Summary   Patient ID: Donald Montoya MRN: [id number] DOB/AGE: 1961-07-02 56 y.o.  Admit date: 06/18/2017 Discharge date: 06/22/2017  Primary Care Physician:  Fleet ContrasAvbuere, Edwin, MD  Discharge Diagnoses:   :  . Hypertension . Dementia . AKI (acute kidney injury) (HCC) . Hypernatremia   SIRS   Consults:  None   Recommendations for Outpatient Follow-up:   1. Please repeat CBC/BMET at next visit 2. Lisinopril discontinued  3. Held seroquel, zoloft for prolonged QTc   DIET: heart healthy diet     Allergies:  No Known Allergies   DISCHARGE MEDICATIONS: Current Discharge Medication List    CONTINUE these medications which have NOT CHANGED   Details  acetaminophen (TYLENOL) 500 MG tablet Take 500 mg by mouth every 4 (four) hours as needed for moderate pain (do not exceed 2000mg  in 24 hours). Do not exceed 2000mg  in 24 hours    alum & mag hydroxide-simeth (MINTOX) 200-200-20 MG/5ML suspension Take 30 mLs by mouth every 6 (six) hours as needed for indigestion or heartburn.    aspirin EC 81 MG tablet Take 81 mg by mouth daily.    atorvastatin (LIPITOR) 10 MG tablet Take 10 mg by mouth at bedtime.    Cholecalciferol (VITAMIN D) 2000 UNITS CAPS Take 2,000 Units by mouth daily.    divalproex (DEPAKOTE ER) 250 MG 24 hr tablet Take 750 mg by mouth at bedtime.    guaifenesin (ROBITUSSIN) 100 MG/5ML syrup Take 200 mg by mouth every 6 (six) hours as needed for cough (no more than 4 doses in 24 hours).    levothyroxine (SYNTHROID, LEVOTHROID) 25 MCG tablet Take 25 mcg by mouth daily before breakfast.    loperamide (IMODIUM) 2 MG capsule Take 2 mg by mouth as needed for diarrhea or loose stools (no more than 8 doses in 24 hours).     magnesium hydroxide (MILK OF MAGNESIA) 400 MG/5ML suspension Take 30 mLs by mouth daily as needed for mild constipation or moderate constipation. Reported on 03/12/2016    metoprolol succinate (TOPROL-XL) 25 MG 24 hr tablet Take  25 mg by mouth 2 (two) times daily.    Neomycin-Bacitracin-Polymyxin (TRIPLE ANTIBIOTIC) [phone] OINT Apply 1 application topically as needed (skin tears/abrasions).    Nutritional Supplements (NUTRITIONAL DRINK MIX PO) Take 1 Container by mouth 2 (two) times daily. Mighty shake     triamcinolone cream (KENALOG) 0.1 % Apply 1 application topically 2 (two) times daily as needed (to rash on legs for flareups).    Zinc Oxide (BALMEX EX) Apply 1 application topically as needed (apply to buttocks and groin with each incontinent change).       STOP taking these medications     lisinopril (PRINIVIL,ZESTRIL) 2.5 MG tablet      QUEtiapine (SEROQUEL) 25 MG tablet      sertraline (ZOLOFT) 50 MG tablet          Brief H and P: For complete details please refer to admission H and P, but in brief Donald Montoya a 56 y.o.malewith a history of advanced dementia, possible history of brain injury, hypothyroidism, and HTN who was sent from memory care facility for change in mental status. He became tachycardic, hypertensive, and responded more slowly to questions than usual, per report and had decreased per oral intake for about 24 hours prior to arrival. He was also more unsteady with possible left facial droop and leaning to the left per staff. On arrival to ED temperature was 99.61F, sinus tachycardia in 110's,  and hypotensive to 99/76. WBC modestly elevated to 11 and sodium noted to be acute up to 157. BUN 36 and creatinine 1.51 up from baseline. Lactate initially elevated to 2.71. Early sepsis was suspected, though UA and CXR showed no evidence of infection. 2L NS were administered, vancomycin and zosyn started, and the patient was admitted.  Hospital Course:   SIRS - Possible SIRS, based on tachycardia and mild leukocytosis,sepsis was ruled out - blood cultures remained negative so far, UA negative for UTI - discontinued IV antibiotics, no fevers    Acute metabolic encephalopathy  in the setting of advanced dementia - Possibly due to acute hypernatremia, CT head showed no acute abnormalities - TSH, B12 normal, HIVnegative - eating breakfast by himself    Hypernatremia - likely due to dehydration, urine osmolality elevated, not consistent with DI, no diuretics - significantly improved, sodium 144 at the time of discharge, patient is eating and drinking without any difficulty.   Acute kidney injury - creatinine1.5 at the time of admission, baseline 1.1 - improved, patient was placed on IV fluids - creatinine 0.8 at the time of discharge.  Advanced dementia - continue Depakote - Hold Zoloft, Seroquel due to prolonged QTc 507    Hypertension - stable, continue metoprolol  Hyperlipidemia - Continue statin  Hypothyroidism - TSH 1.0, continue Synthroid  Hyperglycemia on admission - hemoglobin A1c 5.9, diet control recommended    Day of Discharge BP (!) 172/83 (BP Location: Right Arm)   Pulse 67   Temp 98.1 F (36.7 C) (Oral)   Resp 18   Ht 6\' 1"  (1.854 m)   Wt 67.1 kg (148 lb)   SpO2 96%   BMI 19.53 kg/m   Physical Exam: General: Alert and awake oriented x2, not in any acute distress. HEENT: anicteric sclera, pupils reactive to light and accommodation CVS: S1-S2 clear no murmur rubs or gallops Chest: clear to auscultation bilaterally, no wheezing rales or rhonchi Abdomen: soft nontender, nondistended, normal bowel sounds Extremities: no cyanosis, clubbing or edema noted bilaterally    The results of significant diagnostics from this hospitalization (including imaging, microbiology, ancillary and laboratory) are listed below for reference.    LAB RESULTS: Basic Metabolic Panel:  Recent Labs Lab 06/21/17 1451 06/22/17 0441  NA 128* 144  K 3.5 4.3  CL 94* 110  CO2 27 28  GLUCOSE 485* 84  BUN 13 18  CREATININE 0.89 0.87  CALCIUM 8.0* 9.0   Liver Function Tests:  Recent Labs Lab 06/18/17 1437  AST 45*  ALT 20  ALKPHOS  55  BILITOT 1.1  PROT 6.1*  ALBUMIN 3.2*   No results for input(s): LIPASE, AMYLASE in the last 168 hours.  Recent Labs Lab 06/19/17 1434  AMMONIA 37*   CBC:  Recent Labs Lab 06/18/17 1437 06/18/17 1930  WBC 11.0* 11.2*  HGB 15.6 14.1  HCT 49.6 45.2  MCV 92.5 92.8  PLT 198 167   Cardiac Enzymes: No results for input(s): CKTOTAL, CKMB, CKMBINDEX, TROPONINI in the last 168 hours. BNP: Invalid input(s): POCBNP CBG: No results for input(s): GLUCAP in the last 168 hours.  Significant Diagnostic Studies:  Dg Chest 2 View  Result Date: 06/18/2017 CLINICAL DATA:  Weakness starting about a week ago, fever, dementia. EXAM: CHEST  2 VIEW COMPARISON:  Chest x-ray dated 03/12/2016. FINDINGS: Study is hypoinspiratory. Given the low lung volumes, lungs appear clear. No confluent opacity to suggest a developing pneumonia. No pleural effusions seen. Heart size and mediastinal contours are within normal  limits. No acute or suspicious osseous finding. IMPRESSION: No active cardiopulmonary disease. No evidence of pneumonia or pulmonary edema. Electronically Signed   By: Bary Richard M.D.   On: 06/18/2017 16:21   Ct Head Wo Contrast  Result Date: 06/18/2017 CLINICAL DATA:  Worsening confusion today, unsteady gait. History of severe dementia, brain injury. EXAM: CT HEAD WITHOUT CONTRAST TECHNIQUE: Contiguous axial images were obtained from the base of the skull through the vertex without intravenous contrast. COMPARISON:  CT HEAD Mar 12, 2016 FINDINGS: BRAIN: No intraparenchymal hemorrhage, mass effect nor midline shift. 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 Numerous old basal ganglia and thalamus infarcts with ex vacuo dilatation frontal horns and third ventricle. Moderate to severe parenchymal brain volume loss, advanced for age. Old RIGHT frontal lobe convexity infarct. Patchy supratentorial white matter hypodensities. No midline shift or mass effect. No abnormal extra-axial fluid  collections. Basal cisterns are patent. VASCULAR: Moderate calcific atherosclerosis of the carotid siphons. SKULL: No skull fracture. Old distal LEFT nasal bone fracture. A No significant scalp soft tissue swelling. SINUSES/ORBITS: Minimal LEFT mastoid effusion. Paranasal sinus are well aerated. The included ocular globes and orbital contents are non-suspicious. OTHER: None. IMPRESSION: 1. No acute intracranial process. 2. 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 3. Moderate to severe parenchymal brain volume loss, advanced for age. Electronically Signed   By: Awilda Metro M.D.   On: 06/18/2017 21:34    2D ECHO:   Disposition and Follow-up: Discharge Instructions    Diet - low sodium heart healthy    Complete by:  As directed    Increase activity slowly    Complete by:  As directed        DISPOSITION: SNF   DISCHARGE FOLLOW-UP  Contact information for follow-up providers    [name], MD. Schedule an appointment as soon as possible for a visit in 2 week(s).   Specialty:  Internal Medicine Contact information: [address] [place] [address] [phone]            Contact information for after-discharge care    Destination    [facility] Follow up.   Specialty:  Assisted Living Facility Contact information: [address] [place] [address] [phone]                   Time spent on Discharge: 31 mins   Signed:   Thad Ranger M.D. [facility] 06/22/2017, 10:56 AM Pager: [phone]

## 2017-06-22 DIAGNOSIS — R404 Transient alteration of awareness: Secondary | ICD-10-CM

## 2017-06-22 LAB — BASIC METABOLIC PANEL
ANION GAP: 6 (ref 5–15)
BUN: 18 mg/dL (ref 6–20)
CO2: 28 mmol/L (ref 22–32)
Calcium: 9 mg/dL (ref 8.9–10.3)
Chloride: 110 mmol/L (ref 101–111)
Creatinine, Ser: 0.87 mg/dL (ref 0.61–1.24)
GFR calc Af Amer: >60 mL/min (ref >60)
Glucose, Bld: 84 mg/dL (ref 65–99)
POTASSIUM: 4.3 mmol/L (ref 3.5–5.1)
Sodium: 144 mmol/L (ref 135–145)

## 2017-06-22 NOTE — Care Management Important Message (Signed)
Important Message  Patient Details  Name: Donald Montoya MRN: [id number] Date of Birth: 08/18/1961   Medicare Important Message Given:  Yes    Kyla BalzarineShealy, Emalee Knies Abena 06/22/2017, 9:36 AM

## 2017-06-22 NOTE — Progress Notes (Signed)
[facility]  Patient PIV removed.  Patient discharge vitals Vitals:   06/22/17 1144 06/22/17 1321  BP: (!) 132/58 (!) 159/93  Pulse: 63 68  Resp: 19 19  Temp: 98 F (36.7 C) 98 F (36.7 C)  SpO2: 97% 97%  patient discharge medications Allergies as of 06/22/2017   No Known Allergies     Medication List    STOP taking these medications   lisinopril 2.5 MG tablet Commonly known as:  PRINIVIL,ZESTRIL   QUEtiapine 25 MG tablet Commonly known as:  SEROQUEL   sertraline 50 MG tablet Commonly known as:  ZOLOFT     TAKE these medications   acetaminophen 500 MG tablet Commonly known as:  TYLENOL Take 500 mg by mouth every 4 (four) hours as needed for moderate pain (do not exceed 2000mg  in 24 hours). Do not exceed 2000mg  in 24 hours   aspirin EC 81 MG tablet Take 81 mg by mouth daily.   atorvastatin 10 MG tablet Commonly known as:  LIPITOR Take 10 mg by mouth at bedtime.   BALMEX EX Apply 1 application topically as needed (apply to buttocks and groin with each incontinent change).   divalproex 250 MG 24 hr tablet Commonly known as:  DEPAKOTE ER Take 750 mg by mouth at bedtime.   guaifenesin 100 MG/5ML syrup Commonly known as:  ROBITUSSIN Take 200 mg by mouth every 6 (six) hours as needed for cough (no more than 4 doses in 24 hours).   levothyroxine 25 MCG tablet Commonly known as:  SYNTHROID, LEVOTHROID Take 25 mcg by mouth daily before breakfast.   loperamide 2 MG capsule Commonly known as:  IMODIUM Take 2 mg by mouth as needed for diarrhea or loose stools (no more than 8 doses in 24 hours).   magnesium hydroxide 400 MG/5ML suspension Commonly known as:  MILK OF MAGNESIA Take 30 mLs by mouth daily as needed for mild constipation or moderate constipation. Reported on 03/12/2016   metoprolol succinate 25 MG 24 hr tablet Commonly known as:  TOPROL-XL Take 25 mg by mouth 2 (two) times daily.   MINTOX 200-200-20 MG/5ML  suspension Generic drug:  alum & mag hydroxide-simeth Take 30 mLs by mouth every 6 (six) hours as needed for indigestion or heartburn.   NUTRITIONAL DRINK MIX PO Take 1 Container by mouth 2 (two) times daily. Mighty shake   triamcinolone cream 0.1 % Commonly known as:  KENALOG Apply 1 application topically 2 (two) times daily as needed (to rash on legs for flareups).   TRIPLE ANTIBIOTIC [phone] Oint Apply 1 application topically as needed (skin tears/abrasions).   Vitamin D 2000 units Caps Take 2,000 Units by mouth daily.            Discharge Care Instructions        Start     Ordered   06/22/17 0000  Increase activity slowly     06/22/17 1027   06/22/17 0000  Diet - low sodium heart healthy     06/22/17 1027     Danne HarborGlodean Florance Paolillo, RN  06/22/17

## 2017-06-22 NOTE — Progress Notes (Signed)
Report given to RN at [facility] for [facility].

## 2017-06-22 NOTE — Progress Notes (Signed)
CSW [facility] FL2 and [facility]   Osborne Cascoadia Lamaj Metoyer LCSWA [phone]

## 2017-06-22 NOTE — Progress Notes (Signed)
[facility] Anticipated DC date: 06/22/17 Family notified: N/A Transport by: [facility]   Per MD [facility]. RN, patient, patient's family, and facility notified of DC. Discharge Summary and FL2 sent to facility. RN given number for report [phone]). DC packet on chart. Ambulance transport requested for patient.   CSW signing off.  Cristobal GoldmannNadia Shiquan Mathieu, [place] Clinical Social Worker [phone]

## 2017-06-22 NOTE — [place] FL2 (Signed)
[place] MEDICAID FL2 LEVEL OF CARE SCREENING TOOL     IDENTIFICATION  Patient Name: Donald DikesBobby D Pingleton Birthdate: 02-14-1961 Sex: male Admission Date (Current Location): 06/18/2017  [facility] [place]   Facility and Address:  [facility] [place], [address], [place], [place] [address]      Provider Number: [id number]  Attending Physician Name and Address:  Cathren Harshai, Ripudeep K, MD  Relative Name and Phone Number:       Current Level of Care: Hospital Recommended Level of Care: Memory Care Prior Approval Number:    Date Approved/Denied:   PASRR Number:    Discharge Plan: Other (Comment) (Memory Care)    Current Diagnoses: Patient Active Problem List   Diagnosis Date Noted  . Sepsis (HCC) 06/18/2017  . AKI (acute kidney injury) (HCC) 06/18/2017  . Hypernatremia 06/18/2017  . Near syncope 10/13/2015  . Bradycardia 10/13/2015  . Hypertension 10/13/2015  . Dementia 10/13/2015  . Syncope, cardiogenic 10/13/2015    Orientation RESPIRATION BLADDER Height & Weight     Self  Normal Incontinent Weight: 67.1 kg (148 lb) Height:  6\' 1"  (185.4 cm)  BEHAVIORAL SYMPTOMS/MOOD NEUROLOGICAL BOWEL NUTRITION STATUS      Continent Diet (Regular-Chopped)  AMBULATORY STATUS COMMUNICATION OF NEEDS Skin   Limited Assist Verbally Normal                       Personal Care Assistance Level of Assistance  Bathing, Feeding, Dressing Bathing Assistance: Limited assistance Feeding assistance: Limited assistance Dressing Assistance: Limited assistance     Functional Limitations Info             SPECIAL CARE FACTORS FREQUENCY                       Contractures      Additional Factors Info  Code Status, Allergies Code Status Info: Full Allergies Info: NKA           Current Medications (06/22/2017):    Discharge Medications: CONTINUE these medications which have NOT CHANGED   Details  acetaminophen (TYLENOL) 500 MG  tablet Take 500 mg by mouth every 4 (four) hours as needed for moderate pain (do not exceed 2000mg  in 24 hours). Do not exceed 2000mg  in 24 hours    alum & mag hydroxide-simeth (MINTOX) 200-200-20 MG/5ML suspension Take 30 mLs by mouth every 6 (six) hours as needed for indigestion or heartburn.    aspirin EC 81 MG tablet Take 81 mg by mouth daily.    atorvastatin (LIPITOR) 10 MG tablet Take 10 mg by mouth at bedtime.    Cholecalciferol (VITAMIN D) 2000 UNITS CAPS Take 2,000 Units by mouth daily.    divalproex (DEPAKOTE ER) 250 MG 24 hr tablet Take 750 mg by mouth at bedtime.    guaifenesin (ROBITUSSIN) 100 MG/5ML syrup Take 200 mg by mouth every 6 (six) hours as needed for cough (no more than 4 doses in 24 hours).    levothyroxine (SYNTHROID, LEVOTHROID) 25 MCG tablet Take 25 mcg by mouth daily before breakfast.    loperamide (IMODIUM) 2 MG capsule Take 2 mg by mouth as needed for diarrhea or loose stools (no more than 8 doses in 24 hours).     magnesium hydroxide (MILK OF MAGNESIA) 400 MG/5ML suspension Take 30 mLs by mouth daily as needed for mild constipation or moderate constipation. Reported on 03/12/2016    metoprolol succinate (TOPROL-XL) 25 MG 24 hr  tablet Take 25 mg by mouth 2 (two) times daily.    Neomycin-Bacitracin-Polymyxin (TRIPLE ANTIBIOTIC) [phone] OINT Apply 1 application topically as needed (skin tears/abrasions).    Nutritional Supplements (NUTRITIONAL DRINK MIX PO) Take 1 Container by mouth 2 (two) times daily. Mighty shake     triamcinolone cream (KENALOG) 0.1 % Apply 1 application topically 2 (two) times daily as needed (to rash on legs for flareups).    Zinc Oxide (BALMEX EX) Apply 1 application topically as needed (apply to buttocks and groin with each incontinent change).          STOP taking these medications     lisinopril (PRINIVIL,ZESTRIL) 2.5 MG tablet      QUEtiapine (SEROQUEL) 25 MG tablet      sertraline (ZOLOFT) 50 MG  tablet        Relevant Imaging Results:  Relevant Lab Results:   Additional Information SSN: [id number]  Mearl Latin, LCSWA

## 2017-06-23 LAB — CULTURE, BLOOD (ROUTINE X 2)
CULTURE: NO GROWTH
Culture: NO GROWTH
SPECIAL REQUESTS: ADEQUATE
Special Requests: ADEQUATE

## 2017-11-16 ENCOUNTER — Other Ambulatory Visit: Payer: Self-pay

## 2017-11-16 ENCOUNTER — Emergency Department (HOSPITAL_COMMUNITY): Payer: Medicare Other

## 2017-11-16 ENCOUNTER — Emergency Department (HOSPITAL_COMMUNITY)
Admission: EM | Admit: 2017-11-16 | Discharge: 2017-11-16 | Disposition: A | Discharge status: Home / Self Care | Payer: Medicare Other | Attending: Emergency Medicine | Admitting: Emergency Medicine

## 2017-11-16 ENCOUNTER — Encounter (HOSPITAL_COMMUNITY): Payer: Self-pay | Admitting: Emergency Medicine

## 2017-11-16 DIAGNOSIS — F0390 Unspecified dementia without behavioral disturbance: Secondary | ICD-10-CM | POA: Insufficient documentation

## 2017-11-16 DIAGNOSIS — Y9389 Activity, other specified: Secondary | ICD-10-CM | POA: Diagnosis not present

## 2017-11-16 DIAGNOSIS — Z79899 Other long term (current) drug therapy: Secondary | ICD-10-CM | POA: Diagnosis not present

## 2017-11-16 DIAGNOSIS — Y92129 Unspecified place in nursing home as the place of occurrence of the external cause: Secondary | ICD-10-CM | POA: Insufficient documentation

## 2017-11-16 DIAGNOSIS — Y998 Other external cause status: Secondary | ICD-10-CM | POA: Diagnosis not present

## 2017-11-16 DIAGNOSIS — I10 Essential (primary) hypertension: Secondary | ICD-10-CM | POA: Insufficient documentation

## 2017-11-16 DIAGNOSIS — E079 Disorder of thyroid, unspecified: Secondary | ICD-10-CM | POA: Diagnosis not present

## 2017-11-16 DIAGNOSIS — S022XXA Fracture of nasal bones, initial encounter for closed fracture: Secondary | ICD-10-CM | POA: Insufficient documentation

## 2017-11-16 DIAGNOSIS — R040 Epistaxis: Secondary | ICD-10-CM | POA: Diagnosis not present

## 2017-11-16 DIAGNOSIS — S0990XA Unspecified injury of head, initial encounter: Secondary | ICD-10-CM | POA: Diagnosis present

## 2017-11-16 DIAGNOSIS — S0181XA Laceration without foreign body of other part of head, initial encounter: Secondary | ICD-10-CM | POA: Diagnosis not present

## 2017-11-16 DIAGNOSIS — Z7982 Long term (current) use of aspirin: Secondary | ICD-10-CM | POA: Insufficient documentation

## 2017-11-16 MED ORDER — HYDROCODONE-ACETAMINOPHEN 5-325 MG PO TABS: 1.0000 | ORAL_TABLET | ORAL | 0 refills | Status: DC | PRN

## 2017-11-16 MED ORDER — LIDOCAINE-EPINEPHRINE (PF) 2 %-1:200000 IJ SOLN
10.0000 mL | Freq: Once | INTRAMUSCULAR | Status: AC
  Administered 2017-11-16: 10 mL
  Filled 2017-11-16: qty 20

## 2017-11-16 MED ORDER — HYDROCODONE-ACETAMINOPHEN 5-325 MG PO TABS
1.0000 | ORAL_TABLET | Freq: Once | ORAL | Status: AC
  Administered 2017-11-16: 1 via ORAL
  Filled 2017-11-16: qty 1

## 2017-11-16 NOTE — ED Notes (Signed)
PTAR called to transport pt back to facility 
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
## 2017-11-16 NOTE — ED Provider Notes (Signed)
LACERATION REPAIR Performed by: Karrie Meresortni S Leyton Magoon Authorized by: Karrie Meresortni S Mohid Furuya Consent: Verbal consent obtained. Risks and benefits: risks, benefits and alternatives were discussed Consent given by: patient Patient identity confirmed: provided demographic data Prepped and Draped in normal sterile fashion Wound explored  Laceration Location: superior to left eyelid  Laceration Length: 3.5cm  No Foreign Bodies seen or palpated  Anesthesia: local infiltration  Local anesthetic: lidocaine 2% w epinephrine  Anesthetic total: 1 ml  Irrigation method: syringe Amount of cleaning: standard  Skin closure: dermabond  Number of sutures: na  Technique: dermabond  Patient tolerance: Patient tolerated the procedure well with no immediate complications.  LACERATION REPAIR Performed by: Karrie Meresortni S Lakelynn Severtson Authorized by: Karrie Meresortni S Carlissa Pesola Consent: Verbal consent obtained. Risks and benefits: risks, benefits and alternatives were discussed Consent given by: patient Patient identity confirmed: provided demographic data Prepped and Draped in normal sterile fashion Wound explored  Laceration Location: superior to nose  Laceration Length: 1cm  No Foreign Bodies seen or palpated  Anesthesia: local infiltration  Local anesthetic: none  Anesthetic total: 0 ml  Irrigation method: syringe Amount of cleaning: standard  Skin closure: dermabond  Number of sutures: na  Technique: dermabond  Patient tolerance: Patient tolerated the procedure well with no immediate complications.     Rayne DuCouture, Eudell Julian S, PA-C 11/16/17 1958    Jacalyn LefevreHaviland, Julie, MD 11/16/17 2342
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
## 2017-11-16 NOTE — ED Notes (Signed)
Suture tray and suture cart at bedside.  

## 2017-11-16 NOTE — ED Notes (Signed)
Awaiting PTAR for transport back to facility.

## 2017-11-16 NOTE — ED Notes (Signed)
Bed: [facility] Expected date:  Expected time:  Means of arrival:  Comments: 

## 2018-04-29 IMAGING — DX DG CHEST 2V
3 series · 3 of 3 positions shown · non-contrast
Comparison: Chest x-ray dated 03/12/2016.

CLINICAL DATA: Weakness starting about a week ago, fever, dementia.

EXAM:
CHEST  2 VIEW

[x chest ap]
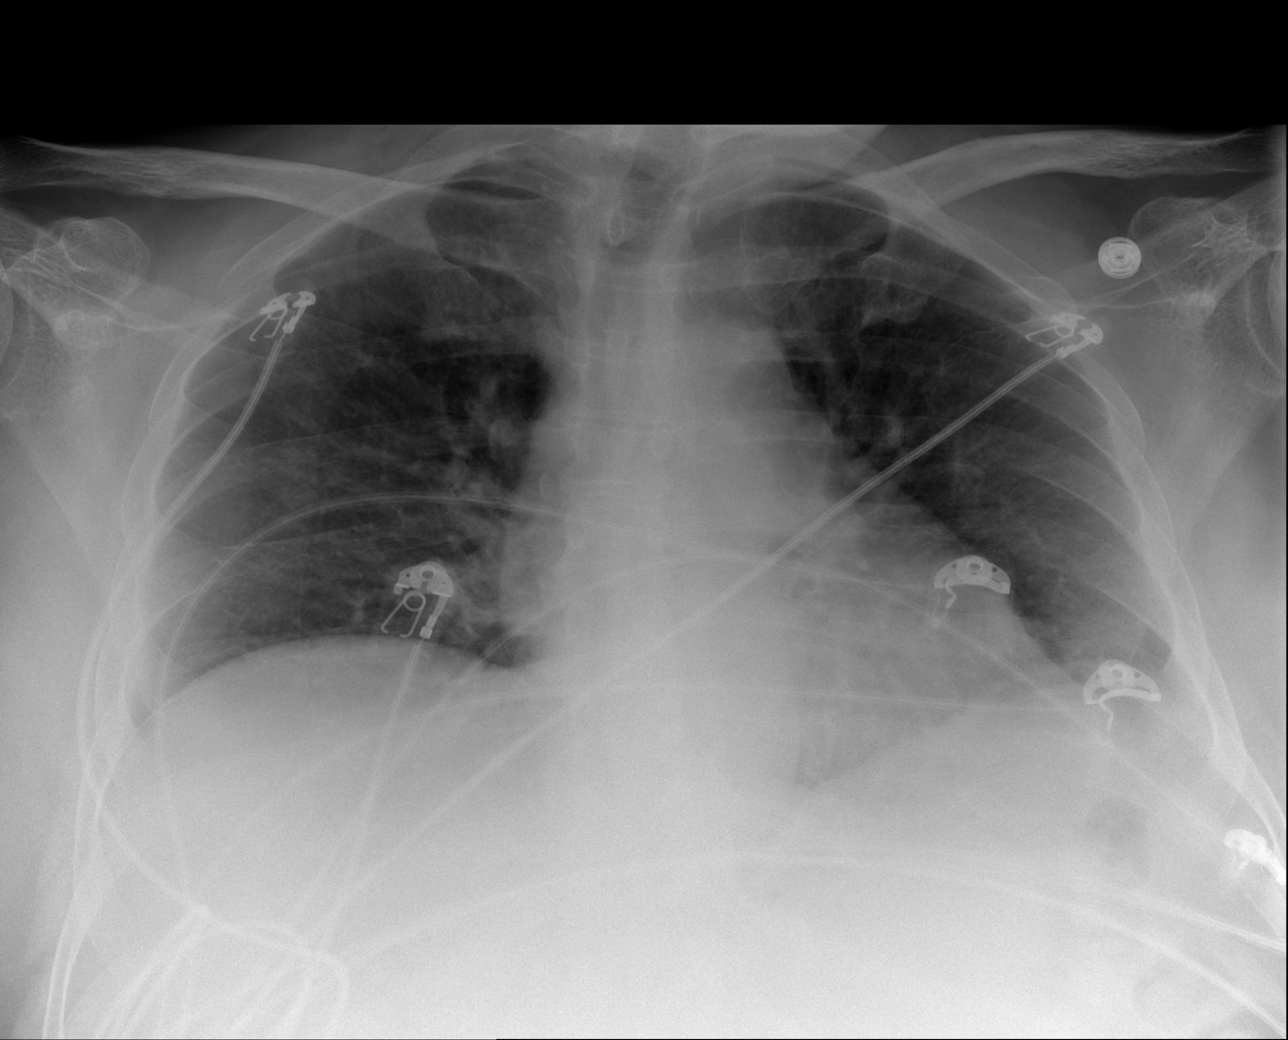

[w chest lat]
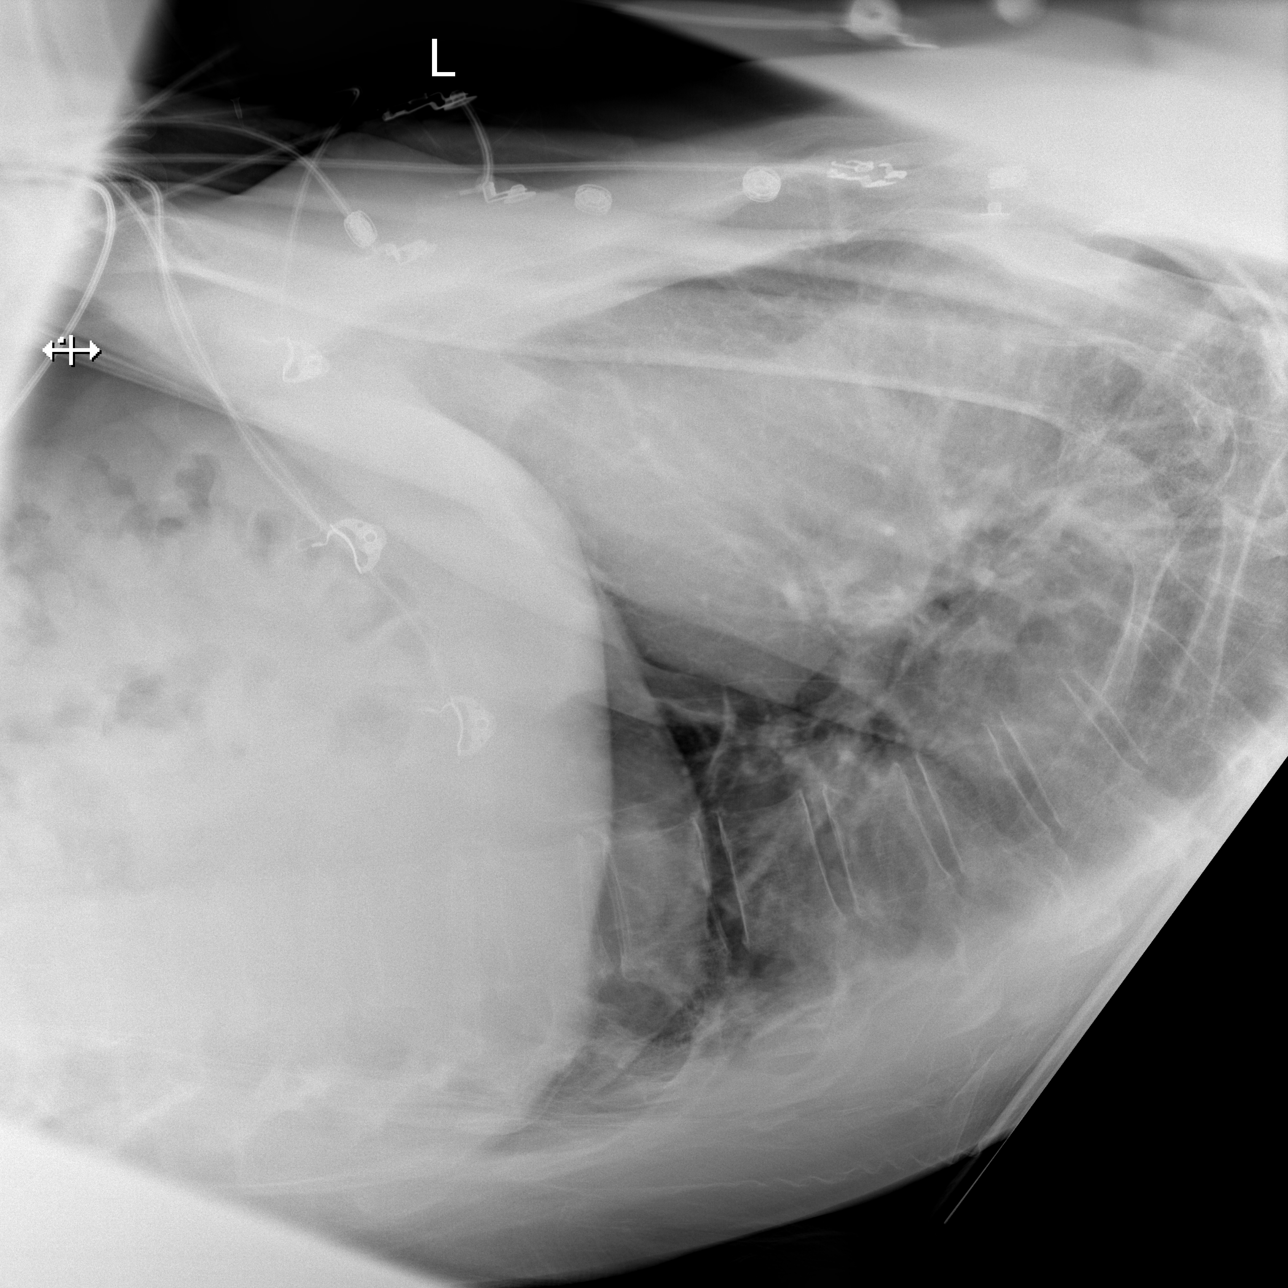

[w chest decub]
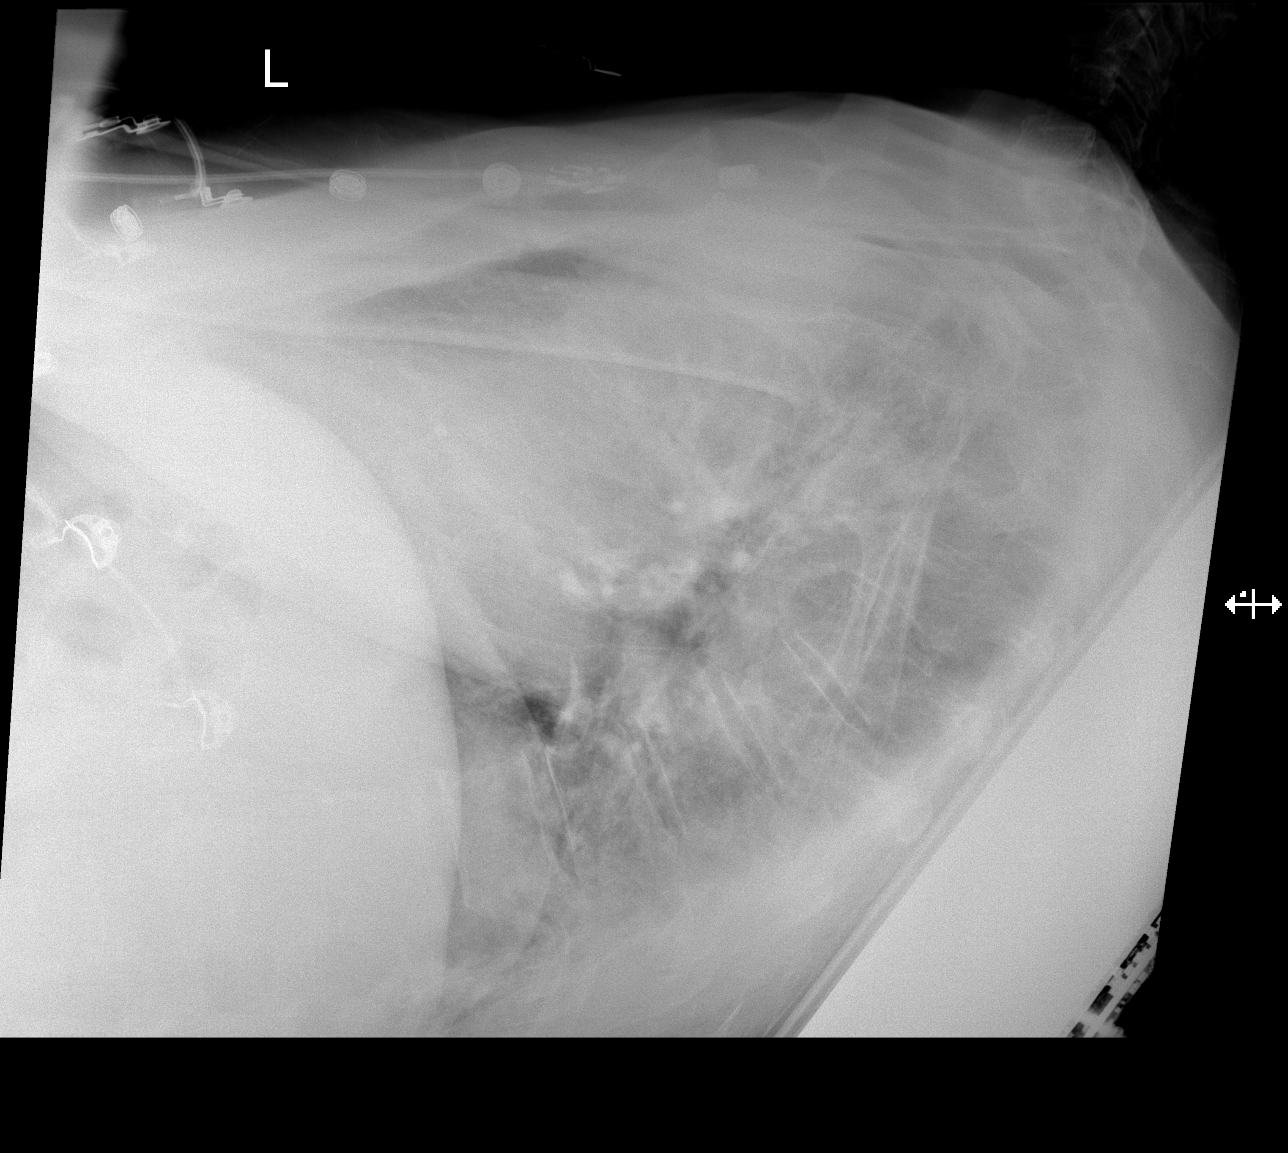

[3 of 3 positions shown; findings below may reference images not displayed]

FINDINGS: Study is hypoinspiratory. Given the low lung volumes, lungs appear
clear. No confluent opacity to suggest a developing pneumonia. No
pleural effusions seen. Heart size and mediastinal contours are
within normal limits. No acute or suspicious osseous finding.
IMPRESSION: No active cardiopulmonary disease. No evidence of pneumonia or
pulmonary edema.

## 2023-12-01 ENCOUNTER — Emergency Department (HOSPITAL_COMMUNITY): Payer: Medicare Other

## 2023-12-01 ENCOUNTER — Other Ambulatory Visit: Payer: Self-pay

## 2023-12-01 ENCOUNTER — Encounter (HOSPITAL_COMMUNITY): Payer: Self-pay

## 2023-12-01 ENCOUNTER — Inpatient Hospital Stay (HOSPITAL_COMMUNITY)
Admission: EM | Admit: 2023-12-01 | Discharge: 2023-12-06 | DRG: 482 | Disposition: A | Discharge status: Skilled Nursing Facility | Payer: Medicare Other | Source: Skilled Nursing Facility | Attending: Internal Medicine | Admitting: Internal Medicine

## 2023-12-01 DIAGNOSIS — D509 Iron deficiency anemia, unspecified: Secondary | ICD-10-CM | POA: Diagnosis present

## 2023-12-01 DIAGNOSIS — Z79890 Hormone replacement therapy (postmenopausal): Secondary | ICD-10-CM

## 2023-12-01 DIAGNOSIS — W050XXA Fall from non-moving wheelchair, initial encounter: Secondary | ICD-10-CM | POA: Diagnosis present

## 2023-12-01 DIAGNOSIS — F0390 Unspecified dementia without behavioral disturbance: Secondary | ICD-10-CM | POA: Diagnosis present

## 2023-12-01 DIAGNOSIS — E079 Disorder of thyroid, unspecified: Secondary | ICD-10-CM | POA: Diagnosis present

## 2023-12-01 DIAGNOSIS — E785 Hyperlipidemia, unspecified: Secondary | ICD-10-CM | POA: Diagnosis present

## 2023-12-01 DIAGNOSIS — I10 Essential (primary) hypertension: Secondary | ICD-10-CM | POA: Diagnosis present

## 2023-12-01 DIAGNOSIS — E039 Hypothyroidism, unspecified: Secondary | ICD-10-CM | POA: Diagnosis present

## 2023-12-01 DIAGNOSIS — Z8673 Personal history of transient ischemic attack (TIA), and cerebral infarction without residual deficits: Secondary | ICD-10-CM

## 2023-12-01 DIAGNOSIS — Z751 Person awaiting admission to adequate facility elsewhere: Secondary | ICD-10-CM

## 2023-12-01 DIAGNOSIS — S72142A Displaced intertrochanteric fracture of left femur, initial encounter for closed fracture: Secondary | ICD-10-CM | POA: Diagnosis present

## 2023-12-01 DIAGNOSIS — Z7982 Long term (current) use of aspirin: Secondary | ICD-10-CM | POA: Diagnosis not present

## 2023-12-01 DIAGNOSIS — Y92009 Unspecified place in unspecified non-institutional (private) residence as the place of occurrence of the external cause: Secondary | ICD-10-CM

## 2023-12-01 DIAGNOSIS — Z79899 Other long term (current) drug therapy: Secondary | ICD-10-CM | POA: Diagnosis not present

## 2023-12-01 DIAGNOSIS — W19XXXA Unspecified fall, initial encounter: Principal | ICD-10-CM

## 2023-12-01 DIAGNOSIS — S72002A Fracture of unspecified part of neck of left femur, initial encounter for closed fracture: Secondary | ICD-10-CM | POA: Diagnosis not present

## 2023-12-01 DIAGNOSIS — M25552 Pain in left hip: Secondary | ICD-10-CM | POA: Diagnosis present

## 2023-12-01 DIAGNOSIS — Y92128 Other place in nursing home as the place of occurrence of the external cause: Secondary | ICD-10-CM

## 2023-12-01 LAB — CBC
HCT: 39.6 % (ref 39.0–52.0)
Hemoglobin: 13.1 g/dL (ref 13.0–17.0)
MCH: 29.6 pg (ref 26.0–34.0)
MCHC: 33.1 g/dL (ref 30.0–36.0)
MCV: 89.4 fL (ref 80.0–100.0)
Platelets: 224 10*3/uL (ref 150–400)
RBC: 4.43 MIL/uL (ref 4.22–5.81)
RDW: 13.5 % (ref 11.5–15.5)
WBC: 6.3 10*3/uL (ref 4.0–10.5)
nRBC: 0 % (ref 0.0–0.2)

## 2023-12-01 LAB — BASIC METABOLIC PANEL
Anion gap: 11 (ref 5–15)
BUN: 20 mg/dL (ref 8–23)
CO2: 32 mmol/L (ref 22–32)
Calcium: 9.3 mg/dL (ref 8.9–10.3)
Chloride: 100 mmol/L (ref 98–111)
Creatinine, Ser: 0.93 mg/dL (ref 0.61–1.24)
GFR, Estimated: >60 mL/min (ref >60)
Glucose, Bld: 98 mg/dL (ref 70–99)
Potassium: 4.2 mmol/L (ref 3.5–5.1)
Sodium: 143 mmol/L (ref 135–145)

## 2023-12-01 LAB — PROTIME-INR
INR: 1 (ref 0.8–1.2)
Prothrombin Time: 12.9 s (ref 11.4–15.2)

## 2023-12-01 MED ORDER — ONDANSETRON HCL 4 MG PO TABS: 4.0000 mg | ORAL_TABLET | Freq: Four times a day (QID) | ORAL | Status: DC | PRN

## 2023-12-01 MED ORDER — DIVALPROEX SODIUM ER 500 MG PO TB24
750.0000 mg | ORAL_TABLET | Freq: Every day | ORAL | Status: DC
  Administered 2023-12-01 – 2023-12-05 (×5): 750 mg via ORAL
  Filled 2023-12-01 (×5): qty 1

## 2023-12-01 MED ORDER — ONDANSETRON HCL 4 MG/2ML IJ SOLN: 4.0000 mg | Freq: Four times a day (QID) | INTRAMUSCULAR | Status: DC | PRN

## 2023-12-01 MED ORDER — ATORVASTATIN CALCIUM 10 MG PO TABS
10.0000 mg | ORAL_TABLET | Freq: Every day | ORAL | Status: DC
  Administered 2023-12-01 – 2023-12-05 (×5): 10 mg via ORAL
  Filled 2023-12-01 (×5): qty 1

## 2023-12-01 MED ORDER — DEXTROSE-SODIUM CHLORIDE 5-0.45 % IV SOLN: INTRAVENOUS | Status: DC

## 2023-12-01 MED ORDER — METOPROLOL SUCCINATE ER 25 MG PO TB24
25.0000 mg | ORAL_TABLET | Freq: Two times a day (BID) | ORAL | Status: DC
  Administered 2023-12-01 – 2023-12-06 (×9): 25 mg via ORAL
  Filled 2023-12-01 (×9): qty 1

## 2023-12-01 MED ORDER — LEVOTHYROXINE SODIUM 25 MCG PO TABS
25.0000 ug | ORAL_TABLET | Freq: Every day | ORAL | Status: DC
  Administered 2023-12-02 – 2023-12-06 (×5): 25 ug via ORAL
  Filled 2023-12-01 (×5): qty 1

## 2023-12-01 MED ORDER — ENSURE ENLIVE PO LIQD
237.0000 mL | Freq: Two times a day (BID) | ORAL | Status: DC
  Administered 2023-12-03 – 2023-12-06 (×7): 237 mL via ORAL

## 2023-12-01 MED ORDER — MORPHINE SULFATE (PF) 2 MG/ML IV SOLN
2.0000 mg | INTRAVENOUS | Status: DC | PRN
  Administered 2023-12-01 – 2023-12-02 (×4): 2 mg via INTRAVENOUS
  Filled 2023-12-01 (×4): qty 1

## 2023-12-01 MED ORDER — FENTANYL CITRATE PF 50 MCG/ML IJ SOSY
50.0000 ug | PREFILLED_SYRINGE | Freq: Once | INTRAMUSCULAR | Status: AC
  Administered 2023-12-01: 50 ug via INTRAVENOUS
  Filled 2023-12-01: qty 1

## 2023-12-01 MED ORDER — LORAZEPAM 2 MG/ML IJ SOLN
1.0000 mg | Freq: Once | INTRAMUSCULAR | Status: AC
Start: 2023-12-01 — End: 2023-12-01
  Administered 2023-12-01: 1 mg via INTRAVENOUS
  Filled 2023-12-01: qty 1

## 2023-12-01 NOTE — H&P (Signed)
 History and Physical    Patient: Donald Montoya FMW:[id number] DOB: 12/04/1960 DOA: 12/01/2023 DOS: the patient was seen and examined on 12/01/2023 PCP: Waylan Savant, MD  Patient coming from: SNF  Chief Complaint:  Chief Complaint  Patient presents with   Fall   HPI: Donald Montoya is a 63 y.o. male with medical history significant of dementia, essential hypertension, hypothyroidism, who was brought in from [facility] skilled facility after a mechanical fall.  Patient fell out of wheelchair after distal was tried to change the patient.  He landed on his left side and immediately felt a pop and started having pain.  Patient did not hit his head.  Did not pass out.  He does have prior history of syncopal episodes, AKI and fall.  Patient is not on any blood thinner.  Brought to the emergency room where workup showed left femoral neck fracture.  He is otherwise hemodynamically stable.  Orthopedics was consulted and plan to surgically repair his fracture in the morning.  Patient being admitted to the medical service.  Patient complained of 10 out of 10 pain with slight movement otherwise no new complaint.  Review of Systems: As mentioned in the history of present illness. All other systems reviewed and are negative. Past Medical History:  Diagnosis Date   AKI (acute kidney injury) (HCC) 05/2017   Dementia (HCC)    Hypertension    Thyroid  disease    Past Surgical History:  Procedure Laterality Date   NO PAST SURGERIES     Social History:  reports that he has never smoked. He has never used smokeless tobacco. He reports that he does not drink alcohol and does not use drugs.  No Known Allergies  Family History  Family history unknown: Yes    Prior to Admission medications   Medication Sig Start Date End Date Taking? Authorizing Provider  acetaminophen  (TYLENOL ) 500 MG tablet Take 500 mg by mouth every 4 (four) hours as needed for moderate pain (do not exceed 2000mg  in 24  hours). Do not exceed 2000mg  in 24 hours    [provider]  alum & mag hydroxide-simeth (MINTOX) 200-200-20 MG/5ML suspension Take 30 mLs by mouth every 6 (six) hours as needed for indigestion or heartburn.    [provider]  aspirin  EC 81 MG tablet Take 81 mg by mouth daily.    [provider]  atorvastatin  (LIPITOR) 10 MG tablet Take 10 mg by mouth at bedtime.    [provider]  Cholecalciferol  (VITAMIN D ) 2000 UNITS CAPS Take 2,000 Units by mouth daily.    [provider]  divalproex  (DEPAKOTE  ER) 250 MG 24 hr tablet Take 750 mg by mouth at bedtime.    [provider]  guaifenesin  (ROBITUSSIN) 100 MG/5ML syrup Take 200 mg by mouth every 6 (six) hours as needed for cough (no more than 4 doses in 24 hours).    [provider]  HYDROcodone -acetaminophen  (NORCO/VICODIN) 5-325 MG tablet Take 1 tablet by mouth every 4 (four) hours as needed. 11/16/17   Dean Clarity, MD  levothyroxine  (SYNTHROID , LEVOTHROID) 25 MCG tablet Take 25 mcg by mouth daily before breakfast.    [provider]  loperamide  (IMODIUM ) 2 MG capsule Take 2 mg by mouth as needed for diarrhea or loose stools (no more than 8 doses in 24 hours).     [provider]  magnesium  hydroxide (MILK OF MAGNESIA) 400 MG/5ML suspension Take 30 mLs by mouth daily as needed for mild constipation  or moderate constipation. Reported on 03/12/2016    [provider]  metoprolol  succinate (TOPROL -XL) 25 MG 24 hr tablet Take 25 mg by mouth 2 (two) times daily.    [provider]  Neomycin -Bacitracin -Polymyxin (TRIPLE ANTIBIOTIC) [phone] OINT Apply 1 application topically as needed (skin tears/abrasions).    [provider]  Nutritional Supplements (NUTRITIONAL DRINK MIX PO) Take 1 Container by mouth 2 (two) times daily. Mighty shake     [provider]  triamcinolone cream (KENALOG) 0.1 % Apply 1 application topically 2 (two) times  daily as needed (to rash on legs for flareups).    [provider]  Zinc Oxide (BALMEX EX) Apply 1 application topically as needed (apply to buttocks and groin with each incontinent change).     [provider]    Physical Exam: Vitals:   12/01/23 1740 12/01/23 1742  BP:  (!) 142/72  Pulse:  62  Resp:  16  Temp:  97.6 F (36.4 C)  TempSrc:  Axillary  SpO2:  95%  Weight: 87 kg   Height: 6' 2 (1.88 m)    Constitutional: Acutely ill looking, NAD, calm, comfortable Eyes: PERRL, lids and conjunctivae normal ENMT: Mucous membranes are moist. Posterior pharynx clear of any exudate or lesions.Normal dentition.  Neck: normal, supple, no masses, no thyromegaly Respiratory: clear to auscultation bilaterally, no wheezing, no crackles. Normal respiratory effort. No accessory muscle use.  Cardiovascular: Regular rate and rhythm, no murmurs / rubs / gallops. No extremity edema. 2+ pedal pulses. No carotid bruits.  Abdomen: no tenderness, no masses palpated. No hepatosplenomegaly. Bowel sounds positive.  Musculoskeletal: Left lower extremity shortened and laterally rotated, tenderness to the left greater trochanter area, Skin: no rashes, lesions, ulcers. No induration Neurologic: CN 2-12 grossly intact. Sensation intact, DTR normal. Strength 5/5 in all 4.  Psychiatric: Confused and disoriented x 2  Data Reviewed:  Vitals and labs appear to be stable, x-ray showed left intertrochanteric fracture  Assessment and Plan:  #1 left intertrochanteric fracture: Patient will be admitted for pain control.  PT and OT will be consulted.  Surgical repair in the morning.  Will keep NPO.  Avoid anticoagulation.  Follow orthopedics recommendation.  #2 status post fall: Appears to be mechanical.  PT and OT after surgery.  #3 essential hypertension: Continue home medications.  Patient will be n.p.o. after midnight tonight.  #4 dementia: No agitation.  Continue home regimen.  #5  hypothyroidism: Continue levothyroxine     Advance Care Planning:   Code Status: Full Code   Consults: Orthopedics Dr. Edna  Family Communication: No family at bedside  Severity of Illness: The appropriate patient status for this patient is INPATIENT. Inpatient status is judged to be reasonable and necessary in order to provide the required intensity of service to ensure the patient's safety. The patient's presenting symptoms, physical exam findings, and initial radiographic and laboratory data in the context of their chronic comorbidities is felt to place them at high risk for further clinical deterioration. Furthermore, it is not anticipated that the patient will be medically stable for discharge from the hospital within 2 midnights of admission.   * I certify that at the point of admission it is my clinical judgment that the patient will require inpatient hospital care spanning beyond 2 midnights from the point of admission due to high intensity of service, high risk for further deterioration and high frequency of surveillance required.*  AuthorBETHA SIM KNOLL, MD 12/01/2023 8:06 PM  For on call review [email or web address].

## 2023-12-01 NOTE — ED Triage Notes (Signed)
 Patient BIB [facility] from [facility]. Has dementia. Donald Montoya out of wheelchair after the staff was trying to change patient. Complaining of left hip pain. Unable to straighten leg. Crying out in pain on arrival.

## 2023-12-01 NOTE — Progress Notes (Signed)
 Patient arrived via stretcher from ED, patient was wearing a brief but it was soaked through with urine and bowel movement. Clothing was removed, patient cleaned up, sacral foam and new brief placed, pillows placed under left leg for support and comfort, patient with dementia and oriented to self only, primary nurse Courtney in room now to do full assessment.

## 2023-12-01 NOTE — Progress Notes (Signed)
 Patient with dementia with what appears to be a left intertroch femur fracture. CT scan pending for better evaluation. Patient would benefit from surgical fixation. Plan for OR possibly tomorrow vs Monday pending medical clearance and OR availability.  Full consult note to follow.

## 2023-12-01 NOTE — ED Provider Notes (Signed)
 [place] EMERGENCY DEPARTMENT AT [facility] Provider Note   CSN: [id number] Arrival date & time: 12/01/23  1736     History  Chief Complaint  Patient presents with   Donald Montoya is a 63 y.o. male with history of dementia, hypertension, thyroid  disease, who presents the emergency department from [facility] nursing facility with concern for a fall.  Staff report that patient fell out of his wheelchair as they were attempting to change him.  He is complaining of left hip pain.  Level 5 caveat due to dementia  I was able to speak to member of staff at facility, they report patient was standing being changed when he slipped and fell onto his L hip. Did not hit his head or lose consciousness. They report he is at his baseline mental status.   Fall       Home Medications Prior to Admission medications   Medication Sig Start Date End Date Taking? Authorizing Provider  acetaminophen  (TYLENOL ) 500 MG tablet Take 500 mg by mouth every 4 (four) hours as needed for moderate pain (do not exceed 2000mg  in 24 hours). Do not exceed 2000mg  in 24 hours    [provider]  alum & mag hydroxide-simeth (MINTOX) 200-200-20 MG/5ML suspension Take 30 mLs by mouth every 6 (six) hours as needed for indigestion or heartburn.    [provider]  aspirin  EC 81 MG tablet Take 81 mg by mouth daily.    [provider]  atorvastatin  (LIPITOR) 10 MG tablet Take 10 mg by mouth at bedtime.    [provider]  Cholecalciferol  (VITAMIN D ) 2000 UNITS CAPS Take 2,000 Units by mouth daily.    [provider]  divalproex  (DEPAKOTE  ER) 250 MG 24 hr tablet Take 750 mg by mouth at bedtime.    [provider]  guaifenesin  (ROBITUSSIN) 100 MG/5ML syrup Take 200 mg by mouth every 6 (six) hours as needed for cough (no more than 4 doses in 24 hours).    [provider]  HYDROcodone -acetaminophen  (NORCO/VICODIN) 5-325 MG tablet Take 1  tablet by mouth every 4 (four) hours as needed. 11/16/17   Dean Clarity, MD  levothyroxine  (SYNTHROID , LEVOTHROID) 25 MCG tablet Take 25 mcg by mouth daily before breakfast.    [provider]  loperamide  (IMODIUM ) 2 MG capsule Take 2 mg by mouth as needed for diarrhea or loose stools (no more than 8 doses in 24 hours).     [provider]  magnesium  hydroxide (MILK OF MAGNESIA) 400 MG/5ML suspension Take 30 mLs by mouth daily as needed for mild constipation or moderate constipation. Reported on 03/12/2016    [provider]  metoprolol  succinate (TOPROL -XL) 25 MG 24 hr tablet Take 25 mg by mouth 2 (two) times daily.    [provider]  Neomycin -Bacitracin -Polymyxin (TRIPLE ANTIBIOTIC) [phone] OINT Apply 1 application topically as needed (skin tears/abrasions).    [provider]  Nutritional Supplements (NUTRITIONAL DRINK MIX PO) Take 1 Container by mouth 2 (two) times daily. Mighty shake     [provider]  triamcinolone cream (KENALOG) 0.1 % Apply 1 application topically 2 (two) times daily as needed (to rash on legs for flareups).    [provider]  Zinc Oxide (BALMEX EX) Apply 1 application topically as needed (apply to buttocks and groin with each incontinent change).     [provider]      Allergies    Patient has no known  allergies.    Review of Systems   Review of Systems  Unable to perform ROS: Dementia  Musculoskeletal:  Positive for arthralgias.    Physical Exam Updated Vital Signs BP (!) 142/72   Pulse 62   Temp 97.6 F (36.4 C) (Axillary)   Resp 16   Ht 6' 2 (1.88 m)   Wt 87 kg   SpO2 95%   BMI 24.63 kg/m  Physical Exam Vitals and nursing note reviewed.  Constitutional:      Appearance: Normal appearance.  HENT:     Head: Normocephalic and atraumatic.  Eyes:     Conjunctiva/sclera: Conjunctivae normal.  Pulmonary:     Effort: Pulmonary effort is normal. No respiratory distress.   Chest:     Comments: Chest wall stable Skin:    General: Skin is warm and dry.  Neurological:     Mental Status: He is alert.  Psychiatric:        Mood and Affect: Mood normal.        Behavior: Behavior normal.     ED Results / Procedures / Treatments   Labs (all labs ordered are listed, but only abnormal results are displayed) Labs Reviewed  CBC  BASIC METABOLIC PANEL  PROTIME-INR    EKG None  Radiology CT Cervical Spine Wo Contrast Result Date: 12/01/2023 CLINICAL DATA:  Polytrauma, blunt.  Fall. EXAM: CT CERVICAL SPINE WITHOUT CONTRAST TECHNIQUE: Multidetector CT imaging of the cervical spine was performed without intravenous contrast. Multiplanar CT image reconstructions were also generated. RADIATION DOSE REDUCTION: This exam was performed according to the departmental dose-optimization program which includes automated exposure control, adjustment of the mA and/or kV according to patient size and/or use of iterative reconstruction technique. COMPARISON:  11/16/2017 FINDINGS: Alignment: Normal Skull base and vertebrae: No acute fracture. No primary bone lesion or focal pathologic process. Soft tissues and spinal canal: No prevertebral fluid or swelling. No visible canal hematoma. Disc levels: Maintained. Anterior spurring in the lower cervical spine. Moderate bilateral degenerative facet disease, right greater than left. Upper chest: No acute findings Other: None IMPRESSION: Degenerative changes.  No acute bony abnormality. Electronically Signed   By: Franky Crease M.D.   On: 12/01/2023 19:31   CT Head Wo Contrast Result Date: 12/01/2023 CLINICAL DATA:  Head trauma, moderate-severe.  Fall. EXAM: CT HEAD WITHOUT CONTRAST TECHNIQUE: Contiguous axial images were obtained from the base of the skull through the vertex without intravenous contrast. RADIATION DOSE REDUCTION: This exam was performed according to the departmental dose-optimization program which includes automated exposure  control, adjustment of the mA and/or kV according to patient size and/or use of iterative reconstruction technique. COMPARISON:  11/16/2017 FINDINGS: Brain: Old left cerebellar infarct, stable. Old bilateral basal ganglia lacunar infarcts, stable. There is atrophy and chronic small vessel disease changes. No acute intracranial abnormality. Specifically, no hemorrhage, hydrocephalus, mass lesion, acute infarction, or significant intracranial injury. Vascular: No hyperdense vessel or unexpected calcification. Skull: No acute calvarial abnormality. Sinuses/Orbits: No acute findings Other: None IMPRESSION: Atrophy, chronic microvascular disease. No acute intracranial abnormality. 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 Electronically Signed   By: Franky Crease M.D.   On: 12/01/2023 19:29    Procedures Procedures    Medications Ordered in ED Medications  LORazepam  (ATIVAN ) injection 1 mg (1 mg Intravenous Given 12/01/23 1820)  fentaNYL  (SUBLIMAZE ) injection 50 mcg (50 mcg Intravenous Given 12/01/23 1943)    ED Course/ Medical Decision Making/ A&P  Medical Decision Making Amount and/or Complexity of Data Reviewed Radiology: ordered.  This patient is a 63 y.o. male  who presents to the ED for concern of fall from standing. No head trauma or LOC.    Differential diagnoses prior to evaluation: The emergent differential diagnosis includes, but is not limited to,  intracranial hemorrhage, fractures, dislocations, internal bleeding. This is not an exhaustive differential.   Past Medical History / Co-morbidities / Social History: dementia, hypertension, thyroid  disease  Additional history: Chart reviewed. Hx of prior falls many years ago. Obtained additional hx via phone with nursing facility.  Physical Exam: Physical exam performed. The pertinent findings include: Mildly hypertensive, otherwise normal vital signs.  At his baseline  mental status per facility.  Chest wall stable, pelvis with left hip tenderness to palpation.  Lab Tests/Imaging studies: I personally interpreted labs/imaging and the pertinent results include: CT head and cervical spine with no acute findings. I agree with the radiologist interpretation.  CXR looks normal on my review. XR left hip appears to be intertrochanteric femoral fracture. Ortho requested XR of left femur and CT left hip.   Basic labs ordered and pending at time of admission.   Medications: I ordered medication including ativan  to help relax patient for imaging.  I have reviewed the patients home medicines and have made adjustments as needed.  Consultations obtained: I consulted with orthopedic surgeon Dr Edna who recommended: admission to hospitalist, plan for OR tomorrow, requests patient be NPO at midnight.   I consulted with hospitalist Dr Sim who will admit.    Disposition: After consideration of the diagnostic results and the patients response to treatment, I feel that patient would benefit from admission for surgical intervention of left hip fracture.   Final Clinical Impression(s) / ED Diagnoses Final diagnoses:  Fall, initial encounter  Closed fracture of left hip, initial encounter [facility])    Rx / DC Orders ED Discharge Orders     None      Portions of this report may have been transcribed using voice recognition software. Every effort was made to ensure accuracy; however, inadvertent computerized transcription errors may be present.    Kyston Gonce T, PA-C 12/01/23 1959    Tegeler, Lonni PARAS, MD 12/01/23 2211

## 2023-12-02 ENCOUNTER — Inpatient Hospital Stay (HOSPITAL_COMMUNITY): Payer: Medicare Other | Admitting: Certified Registered"

## 2023-12-02 ENCOUNTER — Inpatient Hospital Stay (HOSPITAL_COMMUNITY): Payer: Medicare Other

## 2023-12-02 ENCOUNTER — Encounter (HOSPITAL_COMMUNITY)
Admission: EM | Disposition: A | Discharge status: Skilled Nursing Facility | Payer: Self-pay | Source: Skilled Nursing Facility | Attending: Internal Medicine

## 2023-12-02 DIAGNOSIS — I10 Essential (primary) hypertension: Secondary | ICD-10-CM

## 2023-12-02 DIAGNOSIS — E039 Hypothyroidism, unspecified: Secondary | ICD-10-CM | POA: Diagnosis not present

## 2023-12-02 DIAGNOSIS — S72142A Displaced intertrochanteric fracture of left femur, initial encounter for closed fracture: Secondary | ICD-10-CM | POA: Diagnosis not present

## 2023-12-02 DIAGNOSIS — S72002A Fracture of unspecified part of neck of left femur, initial encounter for closed fracture: Secondary | ICD-10-CM | POA: Diagnosis not present

## 2023-12-02 HISTORY — PX: INTRAMEDULLARY (IM) NAIL INTERTROCHANTERIC: SHX5875

## 2023-12-02 LAB — COMPREHENSIVE METABOLIC PANEL
ALT: 22 U/L (ref 0–44)
AST: 30 U/L (ref 15–41)
Albumin: 3.5 g/dL (ref 3.5–5.0)
Alkaline Phosphatase: 99 U/L (ref 38–126)
Anion gap: 12 (ref 5–15)
BUN: 19 mg/dL (ref 8–23)
CO2: 31 mmol/L (ref 22–32)
Calcium: 9.1 mg/dL (ref 8.9–10.3)
Chloride: 101 mmol/L (ref 98–111)
Creatinine, Ser: 0.78 mg/dL (ref 0.61–1.24)
GFR, Estimated: >60 mL/min (ref >60)
Glucose, Bld: 133 mg/dL — ABNORMAL HIGH (ref 70–99)
Potassium: 3.7 mmol/L (ref 3.5–5.1)
Sodium: 144 mmol/L (ref 135–145)
Total Bilirubin: 0.7 mg/dL (ref 0.0–1.2)
Total Protein: 6.7 g/dL (ref 6.5–8.1)

## 2023-12-02 LAB — HIV ANTIBODY (ROUTINE TESTING W REFLEX): HIV Screen 4th Generation wRfx: NONREACTIVE

## 2023-12-02 LAB — CBC
HCT: 38.9 % — ABNORMAL LOW (ref 39.0–52.0)
HCT: 33.1 % — ABNORMAL LOW (ref 39.0–52.0)
Hemoglobin: 12.6 g/dL — ABNORMAL LOW (ref 13.0–17.0)
Hemoglobin: 10.7 g/dL — ABNORMAL LOW (ref 13.0–17.0)
MCH: 28.8 pg (ref 26.0–34.0)
MCH: 28.8 pg (ref 26.0–34.0)
MCHC: 32.4 g/dL (ref 30.0–36.0)
MCHC: 32.3 g/dL (ref 30.0–36.0)
MCV: 89 fL (ref 80.0–100.0)
MCV: 89.2 fL (ref 80.0–100.0)
Platelets: 214 10*3/uL (ref 150–400)
Platelets: 173 10*3/uL (ref 150–400)
RBC: 4.37 MIL/uL (ref 4.22–5.81)
RBC: 3.71 MIL/uL — ABNORMAL LOW (ref 4.22–5.81)
RDW: 13.3 % (ref 11.5–15.5)
RDW: 13.6 % (ref 11.5–15.5)
WBC: 9.1 10*3/uL (ref 4.0–10.5)
WBC: 8.1 10*3/uL (ref 4.0–10.5)
nRBC: 0 % (ref 0.0–0.2)
nRBC: 0 % (ref 0.0–0.2)

## 2023-12-02 LAB — SURGICAL PCR SCREEN
MRSA, PCR: NEGATIVE
Staphylococcus aureus: NEGATIVE

## 2023-12-02 LAB — CREATININE, SERUM
Creatinine, Ser: 0.81 mg/dL (ref 0.61–1.24)
GFR, Estimated: >60 mL/min (ref >60)

## 2023-12-02 SURGERY — FIXATION, FRACTURE, INTERTROCHANTERIC, WITH INTRAMEDULLARY ROD
Anesthesia: General | Site: Hip | Laterality: Left

## 2023-12-02 MED ORDER — PROPOFOL 10 MG/ML IV BOLUS
INTRAVENOUS | Status: AC
  Filled 2023-12-02: qty 20

## 2023-12-02 MED ORDER — FERROUS SULFATE 325 (65 FE) MG PO TABS
325.0000 mg | ORAL_TABLET | Freq: Three times a day (TID) | ORAL | Status: DC
  Administered 2023-12-02 – 2023-12-06 (×9): 325 mg via ORAL
  Filled 2023-12-02 (×10): qty 1

## 2023-12-02 MED ORDER — ACETAMINOPHEN 10 MG/ML IV SOLN
INTRAVENOUS | Status: DC | PRN
  Administered 2023-12-02: 1000 mg via INTRAVENOUS

## 2023-12-02 MED ORDER — CEFAZOLIN SODIUM-DEXTROSE 2-4 GM/100ML-% IV SOLN
2.0000 g | Freq: Four times a day (QID) | INTRAVENOUS | Status: AC
  Administered 2023-12-02 (×2): 2 g via INTRAVENOUS
  Filled 2023-12-02 (×2): qty 100

## 2023-12-02 MED ORDER — MORPHINE SULFATE (PF) 2 MG/ML IV SOLN: 0.5000 mg | INTRAVENOUS | Status: DC | PRN

## 2023-12-02 MED ORDER — ROCURONIUM BROMIDE 100 MG/10ML IV SOLN
INTRAVENOUS | Status: DC | PRN
  Administered 2023-12-02: 60 mg via INTRAVENOUS
  Administered 2023-12-02: 20 mg via INTRAVENOUS

## 2023-12-02 MED ORDER — ENOXAPARIN SODIUM 40 MG/0.4ML IJ SOSY
40.0000 mg | PREFILLED_SYRINGE | INTRAMUSCULAR | Status: DC
  Administered 2023-12-03 – 2023-12-06 (×4): 40 mg via SUBCUTANEOUS
  Filled 2023-12-02 (×4): qty 0.4

## 2023-12-02 MED ORDER — SUGAMMADEX SODIUM 200 MG/2ML IV SOLN
INTRAVENOUS | Status: DC | PRN
  Administered 2023-12-02: 200 mg via INTRAVENOUS

## 2023-12-02 MED ORDER — POLYETHYLENE GLYCOL 3350 17 G PO PACK: 17.0000 g | PACK | Freq: Every day | ORAL | Status: DC | PRN

## 2023-12-02 MED ORDER — BUPIVACAINE HCL (PF) 0.5 % IJ SOLN
INTRAMUSCULAR | Status: DC | PRN
  Administered 2023-12-02: 30 mL

## 2023-12-02 MED ORDER — BUPIVACAINE HCL (PF) 0.5 % IJ SOLN
INTRAMUSCULAR | Status: AC
  Filled 2023-12-02: qty 30

## 2023-12-02 MED ORDER — FENTANYL CITRATE (PF) 100 MCG/2ML IJ SOLN
INTRAMUSCULAR | Status: AC
  Filled 2023-12-02: qty 2

## 2023-12-02 MED ORDER — MAGNESIUM CITRATE PO SOLN: 1.0000 | Freq: Once | ORAL | Status: DC | PRN

## 2023-12-02 MED ORDER — DEXAMETHASONE SODIUM PHOSPHATE 10 MG/ML IJ SOLN
INTRAMUSCULAR | Status: AC
  Filled 2023-12-02: qty 1

## 2023-12-02 MED ORDER — DOCUSATE SODIUM 100 MG PO CAPS
100.0000 mg | ORAL_CAPSULE | Freq: Two times a day (BID) | ORAL | Status: DC
Start: 2023-12-02 — End: 2023-12-06
  Administered 2023-12-02 – 2023-12-06 (×8): 100 mg via ORAL
  Filled 2023-12-02 (×8): qty 1

## 2023-12-02 MED ORDER — TRANEXAMIC ACID-NACL 1000-0.7 MG/100ML-% IV SOLN
1000.0000 mg | INTRAVENOUS | Status: AC
  Administered 2023-12-02: 1000 mg via INTRAVENOUS

## 2023-12-02 MED ORDER — FENTANYL CITRATE PF 50 MCG/ML IJ SOSY: 25.0000 ug | PREFILLED_SYRINGE | INTRAMUSCULAR | Status: DC | PRN

## 2023-12-02 MED ORDER — HYDROCODONE-ACETAMINOPHEN 7.5-325 MG PO TABS: 1.0000 | ORAL_TABLET | ORAL | Status: DC | PRN

## 2023-12-02 MED ORDER — POVIDONE-IODINE 10 % EX SWAB
2.0000 | Freq: Once | CUTANEOUS | Status: DC
  Administered 2023-12-02: 2 via TOPICAL

## 2023-12-02 MED ORDER — TRAMADOL HCL 50 MG PO TABS: 50.0000 mg | ORAL_TABLET | Freq: Four times a day (QID) | ORAL | 0 refills | Status: AC | PRN

## 2023-12-02 MED ORDER — SODIUM CHLORIDE 0.9% FLUSH: 3.0000 mL | INTRAVENOUS | Status: DC | PRN

## 2023-12-02 MED ORDER — CEFAZOLIN SODIUM-DEXTROSE 2-4 GM/100ML-% IV SOLN
2.0000 g | INTRAVENOUS | Status: AC
  Administered 2023-12-02: 2 g via INTRAVENOUS

## 2023-12-02 MED ORDER — TRANEXAMIC ACID-NACL 1000-0.7 MG/100ML-% IV SOLN
1000.0000 mg | Freq: Once | INTRAVENOUS | Status: AC
  Administered 2023-12-02: 1000 mg via INTRAVENOUS
  Filled 2023-12-02: qty 100

## 2023-12-02 MED ORDER — DEXMEDETOMIDINE HCL IN NACL 80 MCG/20ML IV SOLN
INTRAVENOUS | Status: AC
  Filled 2023-12-02: qty 20

## 2023-12-02 MED ORDER — ONDANSETRON HCL 4 MG/2ML IJ SOLN
INTRAMUSCULAR | Status: AC
  Filled 2023-12-02: qty 2

## 2023-12-02 MED ORDER — ENOXAPARIN SODIUM 40 MG/0.4ML IJ SOSY: 40.0000 mg | PREFILLED_SYRINGE | INTRAMUSCULAR | 0 refills | Status: AC

## 2023-12-02 MED ORDER — DEXAMETHASONE SODIUM PHOSPHATE 4 MG/ML IJ SOLN
INTRAMUSCULAR | Status: DC | PRN
  Administered 2023-12-02: 5 mg via INTRAVENOUS

## 2023-12-02 MED ORDER — TRANEXAMIC ACID-NACL 1000-0.7 MG/100ML-% IV SOLN
INTRAVENOUS | Status: AC
  Filled 2023-12-02: qty 100

## 2023-12-02 MED ORDER — METHOCARBAMOL 1000 MG/10ML IJ SOLN: 500.0000 mg | Freq: Four times a day (QID) | INTRAMUSCULAR | Status: DC | PRN

## 2023-12-02 MED ORDER — LACTATED RINGERS IV SOLN: INTRAVENOUS | Status: DC | PRN

## 2023-12-02 MED ORDER — PHENYLEPHRINE HCL-NACL 20-0.9 MG/250ML-% IV SOLN
INTRAVENOUS | Status: DC | PRN
  Administered 2023-12-02: 20 ug/min via INTRAVENOUS

## 2023-12-02 MED ORDER — CEFAZOLIN SODIUM-DEXTROSE 2-4 GM/100ML-% IV SOLN
INTRAVENOUS | Status: AC
  Filled 2023-12-02: qty 100

## 2023-12-02 MED ORDER — PHENOL 1.4 % MT LIQD: 1.0000 | OROMUCOSAL | Status: DC | PRN

## 2023-12-02 MED ORDER — HYDROCODONE-ACETAMINOPHEN 5-325 MG PO TABS
1.0000 | ORAL_TABLET | ORAL | Status: DC | PRN
Start: 2023-12-02 — End: 2023-12-03
  Administered 2023-12-02 – 2023-12-03 (×2): 1 via ORAL
  Filled 2023-12-02 (×2): qty 1

## 2023-12-02 MED ORDER — SENNA 8.6 MG PO TABS
1.0000 | ORAL_TABLET | Freq: Two times a day (BID) | ORAL | Status: DC
  Administered 2023-12-02 – 2023-12-06 (×8): 8.6 mg via ORAL
  Filled 2023-12-02 (×8): qty 1

## 2023-12-02 MED ORDER — ONDANSETRON HCL 4 MG/2ML IJ SOLN
INTRAMUSCULAR | Status: DC | PRN
  Administered 2023-12-02: 4 mg via INTRAVENOUS

## 2023-12-02 MED ORDER — ACETAMINOPHEN 500 MG PO TABS
500.0000 mg | ORAL_TABLET | Freq: Four times a day (QID) | ORAL | Status: AC
  Administered 2023-12-02 – 2023-12-03 (×3): 500 mg via ORAL
  Filled 2023-12-02 (×3): qty 1

## 2023-12-02 MED ORDER — DEXMEDETOMIDINE HCL IN NACL 80 MCG/20ML IV SOLN
INTRAVENOUS | Status: DC | PRN
  Administered 2023-12-02 (×5): 8 ug via INTRAVENOUS

## 2023-12-02 MED ORDER — 0.9 % SODIUM CHLORIDE (POUR BTL) OPTIME
TOPICAL | Status: DC | PRN
  Administered 2023-12-02: 1000 mL

## 2023-12-02 MED ORDER — CHLORHEXIDINE GLUCONATE 4 % EX SOLN
60.0000 mL | Freq: Once | CUTANEOUS | Status: DC
  Administered 2023-12-02: 4 via TOPICAL

## 2023-12-02 MED ORDER — ALUM & MAG HYDROXIDE-SIMETH 200-200-20 MG/5ML PO SUSP: 30.0000 mL | ORAL | Status: DC | PRN

## 2023-12-02 MED ORDER — LIDOCAINE HCL (CARDIAC) PF 100 MG/5ML IV SOSY
PREFILLED_SYRINGE | INTRAVENOUS | Status: DC | PRN
  Administered 2023-12-02: 100 mg via INTRAVENOUS

## 2023-12-02 MED ORDER — HYDROMORPHONE HCL 1 MG/ML IJ SOLN
0.5000 mg | Freq: Once | INTRAMUSCULAR | Status: AC
  Administered 2023-12-02: 0.5 mg via INTRAVENOUS
  Filled 2023-12-02: qty 0.5

## 2023-12-02 MED ORDER — SODIUM CHLORIDE 0.9% FLUSH
3.0000 mL | Freq: Two times a day (BID) | INTRAVENOUS | Status: DC
  Administered 2023-12-02: 10 mL via INTRAVENOUS
  Administered 2023-12-02: 3 mL via INTRAVENOUS

## 2023-12-02 MED ORDER — METHOCARBAMOL 500 MG PO TABS
500.0000 mg | ORAL_TABLET | Freq: Four times a day (QID) | ORAL | Status: DC | PRN
  Administered 2023-12-03 (×2): 500 mg via ORAL
  Filled 2023-12-02 (×2): qty 1

## 2023-12-02 MED ORDER — PROPOFOL 10 MG/ML IV BOLUS
INTRAVENOUS | Status: DC | PRN
  Administered 2023-12-02: 120 mg via INTRAVENOUS

## 2023-12-02 MED ORDER — ACETAMINOPHEN 10 MG/ML IV SOLN
INTRAVENOUS | Status: AC
  Filled 2023-12-02: qty 100

## 2023-12-02 MED ORDER — BISACODYL 10 MG RE SUPP: 10.0000 mg | Freq: Every day | RECTAL | Status: DC | PRN

## 2023-12-02 MED ORDER — MENTHOL 3 MG MT LOZG: 1.0000 | LOZENGE | OROMUCOSAL | Status: DC | PRN

## 2023-12-02 MED ORDER — ONDANSETRON HCL 4 MG PO TABS: 4.0000 mg | ORAL_TABLET | Freq: Four times a day (QID) | ORAL | Status: DC | PRN

## 2023-12-02 MED ORDER — DROPERIDOL 2.5 MG/ML IJ SOLN: 0.6250 mg | Freq: Once | INTRAMUSCULAR | Status: DC | PRN

## 2023-12-02 MED ORDER — ACETAMINOPHEN 325 MG PO TABS
325.0000 mg | ORAL_TABLET | Freq: Four times a day (QID) | ORAL | Status: DC | PRN
  Administered 2023-12-04 – 2023-12-05 (×3): 650 mg via ORAL
  Filled 2023-12-02 (×3): qty 2

## 2023-12-02 MED ORDER — PHENYLEPHRINE HCL (PRESSORS) 10 MG/ML IV SOLN
INTRAVENOUS | Status: DC | PRN
  Administered 2023-12-02: 160 ug via INTRAVENOUS

## 2023-12-02 MED ORDER — ONDANSETRON HCL 4 MG/2ML IJ SOLN: 4.0000 mg | Freq: Four times a day (QID) | INTRAMUSCULAR | Status: DC | PRN

## 2023-12-02 MED ORDER — FENTANYL CITRATE (PF) 100 MCG/2ML IJ SOLN
INTRAMUSCULAR | Status: DC | PRN
  Administered 2023-12-02 (×2): 25 ug via INTRAVENOUS
  Administered 2023-12-02: 50 ug via INTRAVENOUS

## 2023-12-02 SURGICAL SUPPLY — 40 items
BAG COUNTER SPONGE SURGICOUNT (BAG) ×2 IMPLANT
BENZOIN TINCTURE PRP APPL 2/3 (GAUZE/BANDAGES/DRESSINGS) IMPLANT
BIT DRILL CANN LG 4.3MM (BIT) IMPLANT
BNDG COHESIVE 6X5 TAN ST LF (GAUZE/BANDAGES/DRESSINGS) ×2 IMPLANT
CLSR STERI-STRIP ANTIMIC 1/2X4 (GAUZE/BANDAGES/DRESSINGS) IMPLANT
COVER PERINEAL POST (MISCELLANEOUS) ×2 IMPLANT
COVER SURGICAL LIGHT HANDLE (MISCELLANEOUS) ×2 IMPLANT
DERMABOND ADVANCED .7 DNX12 (GAUZE/BANDAGES/DRESSINGS) IMPLANT
DRAPE C-ARM 42X120 X-RAY (DRAPES) ×2 IMPLANT
DRAPE IMP U-DRAPE 54X76 (DRAPES) ×4 IMPLANT
DRAPE INCISE IOBAN 66X45 STRL (DRAPES) IMPLANT
DRAPE STERI IOBAN 125X83 (DRAPES) ×2 IMPLANT
DRAPE U-SHAPE 47X51 STRL (DRAPES) ×4 IMPLANT
DRESSING MEPILEX FLEX 4X4 (GAUZE/BANDAGES/DRESSINGS) ×4 IMPLANT
DRILL BIT CANN LG 4.3MM (BIT) ×1 IMPLANT
DRSG MEPILEX FLEX 4X4 (GAUZE/BANDAGES/DRESSINGS) ×2 IMPLANT
DRSG MEPILEX POST OP 4X8 (GAUZE/BANDAGES/DRESSINGS) IMPLANT
DURAPREP 26ML APPLICATOR (WOUND CARE) ×2 IMPLANT
ELECT REM PT RETURN 15FT ADLT (MISCELLANEOUS) ×2 IMPLANT
GAUZE XEROFORM 5X9 LF (GAUZE/BANDAGES/DRESSINGS) ×2 IMPLANT
GLOVE BIO SURGEON STRL SZ 6.5 (GLOVE) ×2 IMPLANT
GLOVE BIOGEL PI IND STRL 7.0 (GLOVE) ×2 IMPLANT
GLOVE INDICATOR 8.0 STRL GRN (GLOVE) ×2 IMPLANT
GLOVE ORTHO TXT STRL SZ7.5 (GLOVE) ×2 IMPLANT
GOWN STRL SURGICAL XL XLNG (GOWN DISPOSABLE) ×4 IMPLANT
GUIDEPIN VERSANAIL DSP 3.2X444 (ORTHOPEDIC DISPOSABLE SUPPLIES) IMPLANT
KIT BASIN OR (CUSTOM PROCEDURE TRAY) ×2 IMPLANT
KIT TURNOVER KIT A (KITS) IMPLANT
MANIFOLD NEPTUNE II (INSTRUMENTS) ×2 IMPLANT
NAIL HIP FRACT 130D 11X180 (Screw) IMPLANT
NS IRRIG 1000ML POUR BTL (IV SOLUTION) ×2 IMPLANT
PACK GENERAL/GYN (CUSTOM PROCEDURE TRAY) ×2 IMPLANT
PAD ARMBOARD 7.5X6 YLW CONV (MISCELLANEOUS) ×4 IMPLANT
SCREW BONE CORTICAL 5.0X42 (Screw) IMPLANT
SCREW CANN THRD AFF 10.5X100 (Screw) IMPLANT
STRIP CLOSURE SKIN 1/2X4 (GAUZE/BANDAGES/DRESSINGS) IMPLANT
SUT VIC AB 0 CT1 27XBRD ANBCTR (SUTURE) ×2 IMPLANT
SUT VIC AB 3-0 SH 27X BRD (SUTURE) ×4 IMPLANT
TOWEL OR 17X26 10 PK STRL BLUE (TOWEL DISPOSABLE) ×2 IMPLANT
WATER STERILE IRR 1000ML POUR (IV SOLUTION) ×4 IMPLANT

## 2023-12-02 NOTE — Transfer of Care (Signed)
 Immediate Anesthesia Transfer of Care Note  Patient: Donald Montoya  Procedure(s) Performed: INTRAMEDULLARY (IM) NAIL INTERTROCHANTERIC (Left: Hip)  Patient Location: PACU  Anesthesia Type:General  Level of Consciousness: sedated  Airway & Oxygen Therapy: Patient Spontanous Breathing and Patient connected to face mask oxygen  Post-op Assessment: Report given to RN and Post -op Vital signs reviewed and stable  Post vital signs: Reviewed and stable  Last Vitals:  Vitals Value Taken Time  BP 99/50 12/02/23 1123  Temp 36.5 C 12/02/23 1121  Pulse 62 12/02/23 1124  Resp 18 12/02/23 1124  SpO2 100 % 12/02/23 1124  Vitals shown include unfiled device data.  Last Pain:  Vitals:   12/02/23 0726  TempSrc:   PainSc: Asleep      Patients Stated Pain Goal: 3 (12/02/23 0500)  Complications: No notable events documented.

## 2023-12-02 NOTE — Discharge Instructions (Signed)
 Diet: As you were doing prior to hospitalization   Shower:  May shower but keep the wounds dry, use an occlusive plastic wrap, NO SOAKING IN TUB.  If the bandage gets wet, change with a clean dry gauze.  If you have a splint on, leave the splint in place and keep the splint dry with a plastic bag.  Dressing:  You may change your dressing 3-5 days after surgery, unless you have a splint.  If you have a splint, then just leave the splint in place and we will change your bandages during your first follow-up appointment.    If you had hand or foot surgery, we will plan to remove your stitches in about 2 weeks in the office.  For all other surgeries, there are sticky tapes (steri-strips) on your wounds and all the stitches are absorbable.  Leave the steri-strips in place when changing your dressings, they will peel off with time, usually 2-3 weeks.  Activity:  Increase activity slowly as tolerated, but follow the weight bearing instructions below.  The rules on driving is that you can not be taking narcotics while you drive, and you must feel in control of the vehicle.    Weight Bearing:   as tolerated.    To prevent constipation: you may use a stool softener such as -  Colace (over the counter) 100 mg by mouth twice a day  Drink plenty of fluids (prune juice may be helpful) and high fiber foods Miralax (over the counter) for constipation as needed.    Itching:  If you experience itching with your medications, try taking only a single pain pill, or even half a pain pill at a time.  You may take up to 10 pain pills per day, and you can also use benadryl over the counter for itching or also to help with sleep.   Precautions:  If you experience chest pain or shortness of breath - call 911 immediately for transfer to the hospital emergency department!!  If you develop a fever greater that 101 F, purulent drainage from wound, increased redness or drainage from wound, or calf pain -- Call the office at  [phone]                                                Follow- Up Appointment:  Please call for an appointment to be seen in 2 weeks [place] - [phone]

## 2023-12-02 NOTE — Op Note (Signed)
 DATE OF SURGERY:  12/02/2023  TIME: 11:07 AM  PATIENT NAME:  Donald Montoya  AGE: 63 y.o.  PRE-OPERATIVE DIAGNOSIS:  left basicervical femoral neck fracture  POST-OPERATIVE DIAGNOSIS:  SAME  PROCEDURE: Left hip trochanteric femoral nailing  SURGEON:  Fonda SHAUNNA Olmsted  ASSISTANT: None  OPERATIVE IMPLANTS:   Implant Name Type Inv. Item Serial No. Manufacturer Lot No. LRB No. Used Action  NAIL HIP FRACT 130D [id number] - [id number] Screw NAIL HIP FRACT 130D 11X180  [employer](ORTH,TRAU,BIO,SG) [id number] Left 1 Implanted  SCREW CANN THRD AFF 10.5X100 - [id number] Screw SCREW CANN THRD AFF 10.5X100  [employer](ORTH,TRAU,BIO,SG) [id number] Left 1 Implanted  SCREW BONE CORTICAL 5.0X42 - [id number] Screw SCREW BONE CORTICAL 5.0X42  [employer](ORTH,TRAU,BIO,SG) [id number] Left 1 Implanted    UNIQUE ASPECTS OF THE CASE: The bone and femoral head was fairly sclerotic, and fairly strong.  I was a little bit more anterior in the head than I had expected based on the guidepin, but ultimately I was satisfied with the fixation.  The fracture extended into the truck itself, and I placed a guidepin without even using a mallet or wire driver.  I did use a derotational pin during placement of the cephalomedullary fixation in order to minimize risk for rotational malalignment.  ESTIMATED BLOOD LOSS: 75 mL  PREOPERATIVE INDICATIONS:  Donald Montoya is a 63 y.o. year old who fell and suffered a hip fracture. He was brought into the ER and then admitted and optimized and then elected for surgical intervention.  He has advanced dementia, and is effectively nonverbal, and I obtained consent from his sister.  The risks benefits and alternatives were discussed with the patient's family including but not limited to the risks of nonoperative treatment, versus surgical intervention including infection, bleeding, nerve injury, malunion, nonunion, hardware prominence, hardware failure, need for hardware removal,  blood clots, cardiopulmonary complications, morbidity, mortality, among others, and they were willing to proceed.    OPERATIVE PROCEDURE:  The patient was brought to the operating room and placed in the supine position. Anesthesia was administered. He was placed on the fracture table.  Closed reduction was performed under C-arm guidance.  Time out was then performed after sterile prep and drape. He received preoperative antibiotics.  Incision was made proximal to the greater trochanter. A guidewire was placed in the appropriate position. Confirmation was made on AP and lateral views.  The above-named nail was opened. I opened the proximal femur with a reamer. I then placed the nail by hand down. I did not need to ream the femur.  Once the nail was completely seated, I placed a guidepin into the femoral head into the center center position. I measured the length, and then reamed the lateral cortex and up into the head. I then placed the cephalomedullary screw.  I did place an anterior rotational guidepin prior to placement of the cephalomedullary screw.  Slight compression was applied. Anatomic fixation achieved.   I then secured the proximal interlocking bolt, and locked the nail distally using the jig.  I took final C-arm pictures AP and lateral.   Anatomic reconstruction was achieved, and the wounds were irrigated copiously and closed with Vicryl followed by Steri-Strips and sterile gauze for the skin. The patient was awakened and returned to PACU in stable and satisfactory condition. There were no complications and the patient tolerated the procedure well.  He will be weightbearing as tolerated, and will be on chemoprophylaxis for a period of four weeks after discharge.  Fonda olmsted, M.D.

## 2023-12-02 NOTE — Progress Notes (Signed)
 PROGRESS NOTE  Donald Montoya  FMW:[id number] DOB: Mar 05, 1961 DOA: 12/01/2023 PCP: Waylan Savant, MD  Consultants  Brief Narrative: 63 y.o. male with medical history significant of dementia, essential hypertension, hypothyroidism, who was brought in from [facility] skilled facility after a mechanical fall.  Patient fell out of wheelchair landed on his left side and immediately felt a pop and started having pain.  Patient did not hit his head.  Did not pass out.  He does have prior history of syncopal episodes, AKI and fall.  Orthopedics was consulted and plan to surgically repair his fracture in the morning.  Patient being admitted to the medical service.  Patient complained of 10 out of 10 pain with slight movement otherwise no new complaint.    Assessment and Plan:   #1 left intertrochanteric fracture:  -Seen today status post left hip trochanteric femoral nailing. -No notable complications from surgery. -Continue pain control. -PT/OT pending. -Patient now on regular diet.  Appreciate orthopedic input.   #2 status post fall: Appears to be mechanical.  PT and OT after surgery.   #3 essential hypertension:  - Continue home medications.   -BP currently under control. -Taking p.o.  #4 dementia:  -Seems fairly advanced. -He is oriented to person but not place or date.  He was vacantly staring off and feeding himself but essentially just scraping an empty plate with a spoon. -No agitation.  Continue home regimen. -Will need to talk with family/nursing facility to get better baseline   #5 hypothyroidism:  Continue levothyroxine   #6 normocytic anemia: -Likely blood loss from surgery.  Will follow.       DVT prophylaxis:  enoxaparin  (LOVENOX ) injection 40 mg Start: 12/03/23 0800 SCDs Start: 12/02/23 1244  Code Status:   Code Status: Full Code Level of care: Med-Surg Status is: Inpatient Remains inpatient appropriate    Consults called:  Orthopedics  Subjective: Patient seen status post surgery.  Awake and alert.  Oriented only to self.  Staring vacantly often corner of the room.  Attempting to feed himself.  He has eaten most of the food on his plate but there is still a slice of chicken.  He is simply scraping at his empty plate with a spoon and then moving the spoon to his mouth.  Does not really respond to any other questions I ask him.  I did move the chicken to be under spoon and he started to eat that instead  Objective: Vitals:   12/02/23 1205 12/02/23 1224 12/02/23 1502 12/02/23 1740  BP:  131/61 110/63 106/60  Pulse: 63 (!) 59 60 70  Resp: 16 14 16 15   Temp:  (!) 97.4 F (36.3 C) 97.6 F (36.4 C) 97.9 F (36.6 C)  TempSrc:  Axillary    SpO2: 100% 99% 100% 97%  Weight:      Height:        Intake/Output Summary (Last 24 hours) at 12/02/2023 1805 Last data filed at 12/02/2023 1746 Gross per 24 hour  Intake 1301.36 ml  Output 575 ml  Net 726.36 ml   Filed Weights   12/01/23 1740  Weight: 87 kg   Body mass index is 24.63 kg/m.  Gen: 63 y.o. male in no apparent distress.  Nontoxic Pulm: Non-labored breathing.  Clear to auscultation bilaterally.  CV: Regular rate and rhythm. No murmur, rub, or gallop. No JVD GI: Abdomen soft, non-tender, non-distended Ext: Warm, no deformities.  Surgical wrap in place Skin: No rashes, lesions  Neuro: Alert but not oriented.  No obvious neurological deficits. Psych: Calm.  Not really participating in examination otherwise.   I have personally reviewed the following labs and images: CBC: Recent Labs  Lab 12/01/23 1957 12/02/23 0333 12/02/23 1310  WBC 6.3 9.1 8.1  HGB 13.1 12.6* 10.7*  HCT 39.6 38.9* 33.1*  MCV 89.4 89.0 89.2  PLT 224 214 173   BMP &GFR Recent Labs  Lab 12/01/23 1957 12/02/23 0333 12/02/23 1310  NA 143 144  --   K 4.2 3.7  --   CL 100 101  --   CO2 32 31  --   GLUCOSE 98 133*  --   BUN 20 19  --   CREATININE 0.93 0.78 0.81  CALCIUM   9.3 9.1  --    Estimated Creatinine Clearance: 107.1 mL/min (by C-G formula based on SCr of 0.81 mg/dL). Liver & Pancreas: Recent Labs  Lab 12/02/23 0333  AST 30  ALT 22  ALKPHOS 99  BILITOT 0.7  PROT 6.7  ALBUMIN 3.5   No results for input(s): LIPASE, AMYLASE in the last 168 hours. No results for input(s): AMMONIA in the last 168 hours. Diabetic: No results for input(s): HGBA1C in the last 72 hours. No results for input(s): GLUCAP in the last 168 hours. Cardiac Enzymes: No results for input(s): CKTOTAL, CKMB, CKMBINDEX, TROPONINI in the last 168 hours. No results for input(s): PROBNP in the last 8760 hours. Coagulation Profile: Recent Labs  Lab 12/01/23 2005  INR 1.0   Thyroid  Function Tests: No results for input(s): TSH, T4TOTAL, FREET4, T3FREE, THYROIDAB in the last 72 hours. Lipid Profile: No results for input(s): CHOL, HDL, LDLCALC, TRIG, CHOLHDL, LDLDIRECT in the last 72 hours. Anemia Panel: No results for input(s): VITAMINB12, FOLATE, FERRITIN, TIBC, IRON, RETICCTPCT in the last 72 hours. Urine analysis:    Component Value Date/Time   COLORURINE YELLOW 06/18/2017 1808   APPEARANCEUR CLEAR 06/18/2017 1808   LABSPEC 1.024 06/18/2017 1808   PHURINE 5.0 06/18/2017 1808   GLUCOSEU NEGATIVE 06/18/2017 1808   HGBUR NEGATIVE 06/18/2017 1808   BILIRUBINUR NEGATIVE 06/18/2017 1808   KETONESUR NEGATIVE 06/18/2017 1808   PROTEINUR NEGATIVE 06/18/2017 1808   UROBILINOGEN 1.0 08/27/2009 2303   NITRITE NEGATIVE 06/18/2017 1808   LEUKOCYTESUR NEGATIVE 06/18/2017 1808   Sepsis Labs: Invalid input(s): PROCALCITONIN, LACTICIDVEN  Microbiology: Recent Results (from the past 240 hours)  Surgical PCR screen     Status: None   Collection Time: 12/02/23  4:29 AM   Specimen: Nasal Mucosa; Nasal Swab  Result Value Ref Range Status   MRSA, PCR NEGATIVE NEGATIVE Final   Staphylococcus aureus NEGATIVE NEGATIVE Final     Comment: (NOTE) The Xpert SA Assay (FDA approved for NASAL specimens in patients 23 years of age and older), is one component of a comprehensive surveillance program. It is not intended to diagnose infection nor to guide or monitor treatment. Performed at [facility], [address], [place], [place] [address]     Radiology Studies: DG HIP UNILAT W OR W/O PELVIS 2-3 VIEWS LEFT Result Date: 12/02/2023 CLINICAL DATA:  Left hip ORIF. EXAM: DG HIP (WITH OR WITHOUT PELVIS) 2-3V LEFT COMPARISON:  Intraoperative x-rays from same day. Left hip x-rays from yesterday. FINDINGS: Interval gamma nail fixation of the left basicervical femoral neck fracture, now in anatomic alignment. Hardware is intact. Expected postsurgical changes and subcutaneous emphysema in the surrounding soft tissues. IMPRESSION: 1. Interval ORIF of the left basicervical femoral neck fracture. Electronically Signed   By: Elsie ONEIDA Jud CHRISTELLA.D.  On: 12/02/2023 15:05   DG FEMUR 1V LEFT Result Date: 12/02/2023 CLINICAL DATA:  [id number] Surgery, elective [id number]; intraop; surgery, ORIF intertrochanteric left femur fracture EXAM: LEFT FEMUR 1 VIEW; DG C-ARM 1-60 MIN-NO REPORT COMPARISON:  12/01/2023 left femur radiographs FLUOROSCOPY TIME:  Radiation Exposure Index (if provided by the fluoroscopic device): 10.2 mGy FINDINGS: Multiple spot fluoroscopic nondiagnostic intraoperative left femur radiographs demonstrate transfixation of intertrochanteric proximal left femur fracture in near-anatomic alignment by intramedullary rod with interlocking left femoral neck pin and distal interlocking screw. IMPRESSION: Intraoperative fluoroscopic guidance for ORIF of intertrochanteric proximal left femur fracture. Electronically Signed   By: Selinda DELENA Blue M.D.   On: 12/02/2023 11:12   DG C-Arm 1-60 Min-No Report Result Date: 12/02/2023 Fluoroscopy was utilized by the requesting physician.  No radiographic interpretation.   DG C-Arm 1-60 Min-No  Report Result Date: 12/02/2023 Fluoroscopy was utilized by the requesting physician.  No radiographic interpretation.   CT Hip Left Wo Contrast Result Date: 12/01/2023 CLINICAL DATA:  Left femoral neck fracture. EXAM: CT OF THE LEFT HIP WITHOUT CONTRAST TECHNIQUE: Multidetector CT imaging of the left hip was performed according to the standard protocol. Multiplanar CT image reconstructions were also generated. RADIATION DOSE REDUCTION: This exam was performed according to the departmental dose-optimization program which includes automated exposure control, adjustment of the mA and/or kV according to patient size and/or use of iterative reconstruction technique. COMPARISON:  Earlier radiograph dated 12/01/2023. FINDINGS: Bones/Joint/Cartilage There is a comminuted and mildly impacted fracture of the left femoral neck involving the basicervical/intertrochanteric region. There is mild valgus angulation. No dislocation. The bones are osteopenic. No significant effusion. Ligaments Suboptimally assessed by CT. Muscles and Tendons No intramuscular fluid collection or hematoma. Soft tissues No acute findings. IMPRESSION: Comminuted and mildly impacted fracture of the left femoral neck. Electronically Signed   By: Vanetta Chou M.D.   On: 12/01/2023 21:01   DG FEMUR MIN 2 VIEWS LEFT Result Date: 12/01/2023 CLINICAL DATA:  Left femoral neck fracture. EXAM: LEFT FEMUR 2 VIEWS COMPARISON:  Left hip radiograph dated 12/01/2023. FINDINGS: Angulated fracture of the left femoral neck. No other acute fracture. The bones are osteopenic. No dislocation. The soft tissues are unremarkable. IMPRESSION: Angulated fracture of the left femoral neck. Electronically Signed   By: Vanetta Chou M.D.   On: 12/01/2023 20:32   DG HIP UNILAT WITH PELVIS 2-3 VIEWS LEFT Result Date: 12/01/2023 CLINICAL DATA:  Fall and left hip pain. EXAM: DG HIP (WITH OR WITHOUT PELVIS) 2-3V LEFT COMPARISON:  Left knee radiograph dated 03/12/2016.  FINDINGS: There is an intact fracture of the left femoral neck with valgus angulation. The bones are osteopenic. No dislocation. Small calcific densities of the soft tissues of the left hip. IMPRESSION: Left femoral neck fracture. Electronically Signed   By: Vanetta Chou M.D.   On: 12/01/2023 20:08   DG Chest Port 1 View Result Date: 12/01/2023 CLINICAL DATA:  Fall and left hip pain. EXAM: PORTABLE CHEST 1 VIEW COMPARISON:  None Available. FINDINGS: Evaluation is limited due to patient's positioning. No focal consolidation, pleural effusion, or pneumothorax. Stable cardiac silhouette. Osteopenia with degenerative changes of the spine. No acute osseous pathology. IMPRESSION: No active disease. Electronically Signed   By: Vanetta Chou M.D.   On: 12/01/2023 20:07   CT Cervical Spine Wo Contrast Result Date: 12/01/2023 CLINICAL DATA:  Polytrauma, blunt.  Fall. EXAM: CT CERVICAL SPINE WITHOUT CONTRAST TECHNIQUE: Multidetector CT imaging of the cervical spine was performed without intravenous contrast. Multiplanar CT image reconstructions were  also generated. RADIATION DOSE REDUCTION: This exam was performed according to the departmental dose-optimization program which includes automated exposure control, adjustment of the mA and/or kV according to patient size and/or use of iterative reconstruction technique. COMPARISON:  11/16/2017 FINDINGS: Alignment: Normal Skull base and vertebrae: No acute fracture. No primary bone lesion or focal pathologic process. Soft tissues and spinal canal: No prevertebral fluid or swelling. No visible canal hematoma. Disc levels: Maintained. Anterior spurring in the lower cervical spine. Moderate bilateral degenerative facet disease, right greater than left. Upper chest: No acute findings Other: None IMPRESSION: Degenerative changes.  No acute bony abnormality. Electronically Signed   By: Franky Crease M.D.   On: 12/01/2023 19:31   CT Head Wo Contrast Result Date:  12/01/2023 CLINICAL DATA:  Head trauma, moderate-severe.  Fall. EXAM: CT HEAD WITHOUT CONTRAST TECHNIQUE: Contiguous axial images were obtained from the base of the skull through the vertex without intravenous contrast. RADIATION DOSE REDUCTION: This exam was performed according to the departmental dose-optimization program which includes automated exposure control, adjustment of the mA and/or kV according to patient size and/or use of iterative reconstruction technique. COMPARISON:  11/16/2017 FINDINGS: Brain: Old left cerebellar infarct, stable. Old bilateral basal ganglia lacunar infarcts, stable. There is atrophy and chronic small vessel disease changes. No acute intracranial abnormality. Specifically, no hemorrhage, hydrocephalus, mass lesion, acute infarction, or significant intracranial injury. Vascular: No hyperdense vessel or unexpected calcification. Skull: No acute calvarial abnormality. Sinuses/Orbits: No acute findings Other: None IMPRESSION: Atrophy, chronic microvascular disease. No acute intracranial abnormality. 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 Electronically Signed   By: Franky Crease M.D.   On: 12/01/2023 19:29    Scheduled Meds:  acetaminophen   500 mg Oral Q6H   atorvastatin   10 mg Oral QHS   divalproex   750 mg Oral QHS   docusate sodium   100 mg Oral BID   [START ON 12/03/2023] enoxaparin  (LOVENOX ) injection  40 mg Subcutaneous Q24H   feeding supplement  237 mL Oral BID BM   ferrous sulfate   325 mg Oral TID PC   levothyroxine   25 mcg Oral Q0600   metoprolol  succinate  25 mg Oral BID   senna  1 tablet Oral BID   sodium chloride  flush  3-10 mL Intravenous Q12H   Continuous Infusions:   ceFAZolin  (ANCEF ) IV 2 g (12/02/23 1413)     LOS: 1 day   35 minutes with more than 50% spent in reviewing records, counseling patient/family and coordinating care.  Reyes VEAR Gaw, MD [facility] [email or web address] 12/02/2023, 6:05 PM

## 2023-12-02 NOTE — Anesthesia Preprocedure Evaluation (Addendum)
 Anesthesia Evaluation  Patient identified by MRN, date of birth, ID band Patient confused    Reviewed: Allergy & Precautions, NPO status , Patient's Chart, lab work & pertinent test results, reviewed documented beta blocker date and time   History of Anesthesia Complications Negative for: history of anesthetic complications  Airway Mallampati: II  TM Distance: >3 FB Neck ROM: Full    Dental  (+) Poor Dentition, Missing   Pulmonary neg pulmonary ROS   Pulmonary exam normal        Cardiovascular hypertension, Pt. on medications and Pt. on home beta blockers Normal cardiovascular exam     Neuro/Psych       Dementia CVA    GI/Hepatic negative GI ROS, Neg liver ROS,,,  Endo/Other  Hypothyroidism    Renal/GU negative Renal ROS  negative genitourinary   Musculoskeletal left intertrochanteric fracture   Abdominal   Peds  Hematology  (+) Blood dyscrasia (Hgb 12.6, Plt 214k), anemia   Anesthesia Other Findings Day of surgery medications reviewed with patient.  Reproductive/Obstetrics negative OB ROS                             Anesthesia Physical Anesthesia Plan  ASA: 3 and emergent  Anesthesia Plan: General   Post-op Pain Management: Ofirmev  IV (intra-op)* and Precedex    Induction: Intravenous  PONV Risk Score and Plan: 2 and Ondansetron , Dexamethasone  and Treatment may vary due to age or medical condition  Airway Management Planned: Oral ETT  Additional Equipment: None  Intra-op Plan:   Post-operative Plan: Extubation in OR  Informed Consent: I have reviewed the patients History and Physical, chart, labs and discussed the procedure including the risks, benefits and alternatives for the proposed anesthesia with the patient or authorized representative who has indicated his/her understanding and acceptance.     History available from chart only and Consent reviewed with POA  Plan  Discussed with: CRNA  Anesthesia Plan Comments: (Patient oriented to person only. Consent obtained from patient's sister, Rollene, by telephone. Lawence, MD)        Anesthesia Quick Evaluation

## 2023-12-02 NOTE — Anesthesia Procedure Notes (Signed)
 Procedure Name: Intubation Date/Time: 12/02/2023 9:23 AM  Performed by: Dartha Meckel, CRNAPre-anesthesia Checklist: Patient identified, Emergency Drugs available, Suction available and Patient being monitored Patient Re-evaluated:Patient Re-evaluated prior to induction Oxygen Delivery Method: Circle system utilized Preoxygenation: Pre-oxygenation with 100% oxygen Induction Type: IV induction Ventilation: Mask ventilation without difficulty Laryngoscope Size: Glidescope and 3 Tube type: Oral Tube size: 7.5 mm Number of attempts: 1 Airway Equipment and Method: Video-laryngoscopy Placement Confirmation: ETT inserted through vocal cords under direct vision, positive ETCO2 and breath sounds checked- equal and bilateral Secured at: 21 cm Tube secured with: Tape Dental Injury: Teeth and Oropharynx as per pre-operative assessment  Comments: Glidescope used due to poor dentition.

## 2023-12-02 NOTE — Progress Notes (Signed)
 The risks benefits and alternatives were discussed with the patient's sister (POA) including but not limited to the risks of nonoperative treatment, versus surgical intervention including infection, bleeding, nerve injury, malunion, nonunion, the need for revision surgery, hardware prominence, hardware failure, the need for hardware removal, blood clots, cardiopulmonary complications, morbidity, mortality, among others, and they were willing to proceed.   Plan for left hip internal fixation.   Fonda SHAUNNA Olmsted, MD

## 2023-12-02 NOTE — Consult Note (Signed)
 ORTHOPAEDIC CONSULTATION  REQUESTING PHYSICIAN: Elpidio Reyes DEL, MD  Chief Complaint: Left hip fracture  HPI: Donald Montoya is a 63 y.o. male who sustained a left hip fracture after a fall at his memory care facility today.  Patient unable to provide any history due to dementia.  Oriented only to self.  Has had a rough night last night as he is agitated and confused.  He tries to move his left leg on his own causing significant pain and discomfort.  He does smooth move both feet spontaneously.  Past Medical History:  Diagnosis Date   AKI (acute kidney injury) (HCC) 05/2017   Dementia (HCC)    Hypertension    Thyroid  disease    Past Surgical History:  Procedure Laterality Date   NO PAST SURGERIES     Social History   Socioeconomic History   Marital status: Single    Spouse name: Not on file   Number of children: Not on file   Years of education: Not on file   Highest education level: Not on file  Occupational History   Not on file  Tobacco Use   Smoking status: Never   Smokeless tobacco: Never  Vaping Use   Vaping status: Never Used  Substance and Sexual Activity   Alcohol use: No   Drug use: No   Sexual activity: Never  Other Topics Concern   Not on file  Social History Narrative   Not on file   Social Drivers of Health   Financial Resource Strain: Not on file  Food Insecurity: Patient Unable To Answer (12/01/2023)   Hunger Vital Sign    Worried About Running Out of Food in the Last Year: Patient unable to answer    Ran Out of Food in the Last Year: Patient unable to answer  Transportation Needs: Patient Unable To Answer (12/01/2023)   PRAPARE - Transportation    Lack of Transportation (Medical): Patient unable to answer    Lack of Transportation (Non-Medical): Patient unable to answer  Physical Activity: Not on file  Stress: Not on file  Social Connections: Unknown (12/01/2023)   Social Connection and Isolation Panel [NHANES]    Frequency of Communication  with Friends and Family: Patient unable to answer    Frequency of Social Gatherings with Friends and Family: Patient unable to answer    Attends Religious Services: Patient unable to answer    Active Member of Clubs or Organizations: Patient declined    Attends Banker Meetings: Patient unable to answer    Marital Status: Separated   Family History  Family history unknown: Yes   No Known Allergies   Positive ROS: All other systems have been reviewed and were otherwise negative with the exception of those mentioned in the HPI and as above.  Physical Exam: General: Alert, no acute distress Cardiovascular: No pedal edema Respiratory: No cyanosis, no use of accessory musculature Skin: No lesions in the area of chief complaint Neurologic: Sensation intact distally Psychiatric: Patient is competent for consent with normal mood and affect  MUSCULOSKELETAL:  LLE No traumatic wounds, ecchymosis, or rash  Tender about the left hip   pain with log roll  Spontaneous movement of the left ankle  Patient does not cooperate with motor or sensory exam  DP 2+, PT 2+, No significant edema   IMAGING: X-rays and CT scan reviewed demonstrate a basicervical fracture involving the intertrochanteric hip region  Assessment: Principal Problem:   Left displaced femoral neck fracture (HCC) Active Problems:  Hypertension   Dementia (HCC)   Fall at home, initial encounter   Hypothyroidism   Left intertrochanteric femur fracture  Plan: Patient in significant pain with a left intertrochanteric femur fracture would benefit from open reduction internal fixation for pain control and mobility.  Unfortunately patient unable to consent for himself given his dementia.  Have tried to reach out to his contacts in the chart including Dickey Saba which is a nonworking number.  I did call his memory care facility which is [facility] they provided a number for Rollene Lie who they have  tried to call as well as I have tried to call multiple times on left voicemail with inability to reach her.  Delaying the patient's surgery will cause increased pain and stress and ultimately can increase his risk for morbidity and mortality.    TORIBIO DELENA HIGASHI, MD  Contact information:   Tzzxijbd 7am-5pm epic message Dr. Higashi, or call office for patient follow up: [phone] After hours and holidays please check [email or web address] for group call information for [facility]

## 2023-12-03 ENCOUNTER — Encounter (HOSPITAL_COMMUNITY): Payer: Self-pay | Admitting: Orthopedic Surgery

## 2023-12-03 ENCOUNTER — Inpatient Hospital Stay (HOSPITAL_COMMUNITY): Payer: Medicare Other

## 2023-12-03 DIAGNOSIS — Z8673 Personal history of transient ischemic attack (TIA), and cerebral infarction without residual deficits: Secondary | ICD-10-CM | POA: Diagnosis not present

## 2023-12-03 DIAGNOSIS — S72002A Fracture of unspecified part of neck of left femur, initial encounter for closed fracture: Secondary | ICD-10-CM | POA: Diagnosis not present

## 2023-12-03 LAB — BASIC METABOLIC PANEL
Anion gap: 8 (ref 5–15)
BUN: 19 mg/dL (ref 8–23)
CO2: 30 mmol/L (ref 22–32)
Calcium: 8.1 mg/dL — ABNORMAL LOW (ref 8.9–10.3)
Chloride: 99 mmol/L (ref 98–111)
Creatinine, Ser: 0.75 mg/dL (ref 0.61–1.24)
GFR, Estimated: >60 mL/min (ref >60)
Glucose, Bld: 102 mg/dL — ABNORMAL HIGH (ref 70–99)
Potassium: 3.7 mmol/L (ref 3.5–5.1)
Sodium: 137 mmol/L (ref 135–145)

## 2023-12-03 LAB — CBC
HCT: 29.9 % — ABNORMAL LOW (ref 39.0–52.0)
Hemoglobin: 9.9 g/dL — ABNORMAL LOW (ref 13.0–17.0)
MCH: 29.5 pg (ref 26.0–34.0)
MCHC: 33.1 g/dL (ref 30.0–36.0)
MCV: 89 fL (ref 80.0–100.0)
Platelets: 171 10*3/uL (ref 150–400)
RBC: 3.36 MIL/uL — ABNORMAL LOW (ref 4.22–5.81)
RDW: 13.5 % (ref 11.5–15.5)
WBC: 9.4 10*3/uL (ref 4.0–10.5)
nRBC: 0 % (ref 0.0–0.2)

## 2023-12-03 LAB — VITAMIN B12: Vitamin B-12: 375 pg/mL (ref 180–914)

## 2023-12-03 LAB — TSH: TSH: 0.49 u[IU]/mL (ref 0.350–4.500)

## 2023-12-03 MED ORDER — POLYETHYLENE GLYCOL 3350 17 G PO PACK
34.0000 g | PACK | Freq: Every day | ORAL | Status: DC
  Administered 2023-12-03 – 2023-12-06 (×4): 34 g via ORAL
  Filled 2023-12-03 (×4): qty 2

## 2023-12-03 MED ORDER — OXYCODONE HCL 5 MG PO TABS
5.0000 mg | ORAL_TABLET | Freq: Four times a day (QID) | ORAL | Status: DC | PRN
  Administered 2023-12-03: 5 mg via ORAL
  Administered 2023-12-04 (×3): 10 mg via ORAL
  Filled 2023-12-03 (×2): qty 2
  Filled 2023-12-03: qty 1
  Filled 2023-12-03: qty 2

## 2023-12-03 MED ORDER — ASPIRIN 81 MG PO TBEC
81.0000 mg | DELAYED_RELEASE_TABLET | Freq: Every day | ORAL | Status: DC
  Administered 2023-12-03 – 2023-12-06 (×4): 81 mg via ORAL
  Filled 2023-12-03 (×4): qty 1

## 2023-12-03 NOTE — Progress Notes (Signed)
     Subjective:  Patient awake and interactive, much more than yesterday.    Yesterday's total administered Morphine  [place] Equivalents: 57   Objective:   VITALS:   Vitals:   12/02/23 1502 12/02/23 1740 12/02/23 2019 12/03/23 0511  BP: 110/63 106/60 111/73 117/66  Pulse: 60 70 67 73  Resp: 16 15 18 18   Temp: 97.6 F (36.4 C) 97.9 F (36.6 C) 98.8 F (37.1 C) 97.6 F (36.4 C)  TempSrc:   Oral Oral  SpO2: 100% 97% 97% 96%  Weight:      Height:        Neurologically intact He has pulled all of his dressings offf.  Lab Results  Component Value Date   WBC 9.4 12/03/2023   HGB 9.9 (L) 12/03/2023   HCT 29.9 (L) 12/03/2023   MCV 89.0 12/03/2023   PLT 171 12/03/2023   BMET    Component Value Date/Time   NA 137 12/03/2023 0302   K 3.7 12/03/2023 0302   CL 99 12/03/2023 0302   CO2 30 12/03/2023 0302   GLUCOSE 102 (H) 12/03/2023 0302   BUN 19 12/03/2023 0302   CREATININE 0.75 12/03/2023 0302   CALCIUM  8.1 (L) 12/03/2023 0302   GFRNONAA >60 12/03/2023 0302     Assessment/Plan: 1 Day Post-Op   Principal Problem:   Left displaced femoral neck fracture (HCC) Active Problems:   Hypertension   Dementia (HCC)   Fall at home, initial encounter   Hypothyroidism   Will reapply dressings.  I also used glue for closure so hopefully that will hold.    WBAT OK for anticoagulation RTC with me in 2 weeks after discharge.   Tylenol  for pain, avoid narcotics if possible.     Neville Barbone 12/03/2023, 7:36 AM   Osa Blase, MD Cell [phone]

## 2023-12-03 NOTE — Progress Notes (Signed)
 Pt noted to be coughing while eating lunch and thin liquid. Speech consult put in and got evaluated. Diet changed to Dys 3 with nectar thick liquid. Pt tolerated thickened liquid well with meds. Will continue to monitor.

## 2023-12-03 NOTE — Plan of Care (Signed)

## 2023-12-03 NOTE — Evaluation (Signed)
 Clinical/Bedside Swallow Evaluation Patient Details  Name: Donald Montoya MRN: [id number] Date of Birth: 1961/08/16  Today's Date: 12/03/2023 Time: SLP Start Time (ACUTE ONLY): 1505 SLP Stop Time (ACUTE ONLY): 1515 SLP Time Calculation (min) (ACUTE ONLY): 10 min  Past Medical History:  Past Medical History:  Diagnosis Date   AKI (acute kidney injury) (HCC) 05/2017   Dementia (HCC)    Hypertension    Thyroid  disease    Past Surgical History:  Past Surgical History:  Procedure Laterality Date   INTRAMEDULLARY (IM) NAIL INTERTROCHANTERIC Left 12/02/2023   Procedure: INTRAMEDULLARY (IM) NAIL INTERTROCHANTERIC;  Surgeon: Osa Blase, MD;  Location: [facility];  Service: Orthopedics;  Laterality: Left;   NO PAST SURGERIES     HPI:  Patient is a 63 y.o. male with PMH: dementia, essential HTN, hypothyroidism who presented to the hospital from his SNF after falling out of his WC on left side. He has prior h/o syncopal episodes and fall. He underwent a left hip trochanteric femoral nailing on 12/02/23. On 2/10, he was observed by PT and nursing to be coughing excessively when drinking liquids, prompting SLP swallow evaluation.    Assessment / Plan / Recommendation  Clinical Impression  Patient is presenting with clinical s/s of dysphagia as per this bedside swallow evaluation. SLP suspects his impulsivity with rapid PO intake is a contributing factor. Patient exhibited immediate coughing with thin liquids via straw sips. He would try to continue drinking before he was finished coughing. With nectar thick liquids, he did not exhibit immediate coughing but did exhibit an instance of delayed cough. He was able to masticate piece of graham cracker and exhibited a delayed cough, however unable to differentiate cause of cough being from liquid PO's or solid PO's. SLP is recommending downgrade diet to Dys 3 (mechanical soft) solids, nectar thick liquids. He will benefit from full supervision to help reduce  his impulsive, rapid PO intake. SLP discussed recommendations with RN after evaluation. SLP Visit Diagnosis: Dysphagia, unspecified (R13.10)    Aspiration Risk  Mild aspiration risk    Diet Recommendation Dysphagia 3 (Mech soft);Nectar-thick liquid    Liquid Administration via: Cup;Straw Medication Administration: Whole meds with puree Supervision: Full supervision/cueing for compensatory strategies;Patient able to self feed Compensations: Slow rate;Small sips/bites;Minimize environmental distractions Postural Changes: Seated upright at 90 degrees    Other  Recommendations Oral Care Recommendations: Oral care BID    Recommendations for follow up therapy are one component of a multi-disciplinary discharge planning process, led by the attending physician.  Recommendations may be updated based on patient status, additional functional criteria and insurance authorization.  Follow up Recommendations Skilled nursing-short term rehab (<3 hours/day)      Assistance Recommended at Discharge    Functional Status Assessment Patient has had a recent decline in their functional status and demonstrates the ability to make significant improvements in function in a reasonable and predictable amount of time.  Frequency and Duration min 2x/week  1 week       Prognosis Prognosis for improved oropharyngeal function: Fair Barriers to Reach Goals: Cognitive deficits;Behavior      Swallow Study   General Date of Onset: 12/03/23 HPI: Patient is a 63 y.o. male with PMH: dementia, essential HTN, hypothyroidism who presented to the hospital from his SNF after falling out of his WC on left side. He has prior h/o syncopal episodes and fall. He underwent a left hip trochanteric femoral nailing on 12/02/23. On 2/10, he was observed by PT and nursing to be  coughing excessively when drinking liquids, prompting SLP swallow evaluation. Type of Study: Bedside Swallow Evaluation Previous Swallow Assessment: none  found Diet Prior to this Study: Regular;Thin liquids (Level 0) Temperature Spikes Noted: No Respiratory Status: Room air History of Recent Intubation: Yes Total duration of intubation (days):  (for surgery only) Date extubated: 12/02/23 Behavior/Cognition: Alert;Cooperative;Impulsive Oral Cavity Assessment: Within Functional Limits Oral Care Completed by SLP: No Oral Cavity - Dentition: Poor condition;Missing dentition Vision: Functional for self-feeding Self-Feeding Abilities: Able to feed self Patient Positioning: Upright in bed Baseline Vocal Quality: Low vocal intensity Volitional Cough: Cognitively unable to elicit Volitional Swallow: Unable to elicit    Oral/Motor/Sensory Function Overall Oral Motor/Sensory Function: Within functional limits   Ice Chips     Thin Liquid Thin Liquid: Impaired Presentation: Self Fed;Straw Pharyngeal  Phase Impairments: Cough - Immediate;Cough - Delayed    Nectar Thick Nectar Thick Liquid: Impaired Presentation: Straw;Self Fed Pharyngeal Phase Impairments: Cough - Delayed   Honey Thick     Puree Puree: Not tested   Solid     Solid: Impaired Oral Phase Impairments: Impaired mastication Pharyngeal Phase Impairments: Cough - Delayed     Jacqualine Mater, MA, CCC-SLP Speech Therapy

## 2023-12-03 NOTE — Progress Notes (Signed)
 PROGRESS NOTE  VINNY COLEE  ZOX:[id number] DOB: 20-May-1961 DOA: 12/01/2023 PCP: Georgiana Kirks, MD  Consultants  Brief Narrative: 63 y.o. male with medical history significant of dementia, essential hypertension, hypothyroidism, who was brought in from [place] skilled facility after a mechanical fall.  Patient fell out of wheelchair landed on his left side and immediately felt a pop and started having pain.  Patient did not hit his head.  Did not pass out.  He does have prior history of syncopal episodes, AKI and fall.  Orthopedics was consulted and plan to surgically repair his fracture in the morning.  Patient being admitted to the medical service.  Patient complained of 10 out of 10 pain with slight movement otherwise no new complaint.    Assessment and Plan:    left intertrochanteric fracture:  -Seen today status post left hip trochanteric femoral nailing on 2/9 with orthopedics -No notable complications from surgery. H/h appropriate today -Continue pain control, bowel mgmt. No bm yet since surgery -PT advising snf, TOC consulted   essential hypertension:  controlled - Continue home medications.     dementia:  -Seems fairly advanced, resides in memory care -No agitation.  Continue home regimen. - f/u tsh, b12  History CVA Remote infarcts seen on CT head -cont asa, statin - check LDL - check carotid u/s as is not clear from medical record that patient's prior CVAs have been worked up   hypothyroidism:  Continue home levothyroxine         DVT prophylaxis:  enoxaparin  (LOVENOX ) injection 40 mg Start: 12/03/23 0800 SCDs Start: 12/02/23 1244  Code Status:   Code Status: Full Code Level of care: Med-Surg Status is: Inpatient Remains inpatient pending snf placement Family communication:  no answer when sister called today   Consults called: Orthopedics  Subjective: Patient seen status post surgery.  Awake and alert.  Reports mild left hip pain, tolerating  diet, no bm  Objective: Vitals:   12/02/23 2019 12/03/23 0511 12/03/23 0848 12/03/23 1319  BP: 111/73 117/66 113/70 131/74  Pulse: 67 73 70 80  Resp: 18 18 18 16   Temp: 98.8 F (37.1 C) 97.6 F (36.4 C)  98.6 F (37 C)  TempSrc: Oral Oral    SpO2: 97% 96% 96% 100%  Weight:      Height:        Intake/Output Summary (Last 24 hours) at 12/03/2023 1346 Last data filed at 12/03/2023 0848 Gross per 24 hour  Intake 653.6 ml  Output 1030 ml  Net -376.4 ml   Filed Weights   12/01/23 1740  Weight: 87 kg   Body mass index is 24.63 kg/m.  Gen: 63 y.o. male in no apparent distress.  Nontoxic Pulm: Non-labored breathing.  Clear to auscultation bilaterally.  CV: Regular rate and rhythm. No murmur,  GI: Abdomen soft, non-tender, non-distended Ext: Warm, no deformities.  Surgical dressings left hip c/d/i Skin: No rashes, lesions  Neuro: Alert but not oriented.  No obvious neurological deficits. Psych: Calm.     I have personally reviewed the following labs and images: CBC: Recent Labs  Lab 12/01/23 1957 12/02/23 0333 12/02/23 1310 12/03/23 0302  WBC 6.3 9.1 8.1 9.4  HGB 13.1 12.6* 10.7* 9.9*  HCT 39.6 38.9* 33.1* 29.9*  MCV 89.4 89.0 89.2 89.0  PLT 224 214 173 171   BMP &GFR Recent Labs  Lab 12/01/23 1957 12/02/23 0333 12/02/23 1310 12/03/23 0302  NA 143 144  --  137  K 4.2 3.7  --  3.7  CL 100 101  --  99  CO2 32 31  --  30  GLUCOSE 98 133*  --  102*  BUN 20 19  --  19  CREATININE 0.93 0.78 0.81 0.75  CALCIUM  9.3 9.1  --  8.1*   Estimated Creatinine Clearance: 108.5 mL/min (by C-G formula based on SCr of 0.75 mg/dL). Liver & Pancreas: Recent Labs  Lab 12/02/23 0333  AST 30  ALT 22  ALKPHOS 99  BILITOT 0.7  PROT 6.7  ALBUMIN 3.5   No results for input(s): "LIPASE", "AMYLASE" in the last 168 hours. No results for input(s): "AMMONIA" in the last 168 hours. Diabetic: No results for input(s): "HGBA1C" in the last 72 hours. No results for input(s):  "GLUCAP" in the last 168 hours. Cardiac Enzymes: No results for input(s): "CKTOTAL", "CKMB", "CKMBINDEX", "TROPONINI" in the last 168 hours. No results for input(s): "PROBNP" in the last 8760 hours. Coagulation Profile: Recent Labs  Lab 12/01/23 2005  INR 1.0   Thyroid  Function Tests: No results for input(s): "TSH", "T4TOTAL", "FREET4", "T3FREE", "THYROIDAB" in the last 72 hours. Lipid Profile: No results for input(s): "CHOL", "HDL", "LDLCALC", "TRIG", "CHOLHDL", "LDLDIRECT" in the last 72 hours. Anemia Panel: No results for input(s): "VITAMINB12", "FOLATE", "FERRITIN", "TIBC", "IRON", "RETICCTPCT" in the last 72 hours. Urine analysis:    Component Value Date/Time   COLORURINE YELLOW 06/18/2017 1808   APPEARANCEUR CLEAR 06/18/2017 1808   LABSPEC 1.024 06/18/2017 1808   PHURINE 5.0 06/18/2017 1808   GLUCOSEU NEGATIVE 06/18/2017 1808   HGBUR NEGATIVE 06/18/2017 1808   BILIRUBINUR NEGATIVE 06/18/2017 1808   KETONESUR NEGATIVE 06/18/2017 1808   PROTEINUR NEGATIVE 06/18/2017 1808   UROBILINOGEN 1.0 08/27/2009 2303   NITRITE NEGATIVE 06/18/2017 1808   LEUKOCYTESUR NEGATIVE 06/18/2017 1808   Sepsis Labs: Invalid input(s): "PROCALCITONIN", "LACTICIDVEN"  Microbiology: Recent Results (from the past 240 hours)  Surgical PCR screen     Status: None   Collection Time: 12/02/23  4:29 AM   Specimen: Nasal Mucosa; Nasal Swab  Result Value Ref Range Status   MRSA, PCR NEGATIVE NEGATIVE Final   Staphylococcus aureus NEGATIVE NEGATIVE Final    Comment: (NOTE) The Xpert SA Assay (FDA approved for NASAL specimens in patients 70 years of age and older), is one component of a comprehensive surveillance program. It is not intended to diagnose infection nor to guide or monitor treatment. Performed at [facility], [address], [place], [place] [address]     Radiology Studies: No results found.   Scheduled Meds:  atorvastatin   10 mg Oral QHS   divalproex   750  mg Oral QHS   docusate sodium   100 mg Oral BID   enoxaparin  (LOVENOX ) injection  40 mg Subcutaneous Q24H   feeding supplement  237 mL Oral BID BM   ferrous sulfate   325 mg Oral TID PC   levothyroxine   25 mcg Oral Q0600   metoprolol  succinate  25 mg Oral BID   senna  1 tablet Oral BID   sodium chloride  flush  3-10 mL Intravenous Q12H   Continuous Infusions:     LOS: 2 days     Raymonde Calico, MD [facility] [email or web address] 12/03/2023, 1:46 PM

## 2023-12-03 NOTE — Anesthesia Postprocedure Evaluation (Addendum)
 Anesthesia Post Note  Patient: Donald Montoya  Procedure(s) Performed: INTRAMEDULLARY (IM) NAIL INTERTROCHANTERIC (Left: Hip)     Patient location during evaluation: PACU Anesthesia Type: General Level of consciousness: awake and confused Pain management: pain level controlled Vital Signs Assessment: post-procedure vital signs reviewed and stable Respiratory status: spontaneous breathing, nonlabored ventilation and respiratory function stable Cardiovascular status: blood pressure returned to baseline Postop Assessment: no apparent nausea or vomiting Anesthetic complications: no  No notable events documented.            Vertell Row

## 2023-12-03 NOTE — Evaluation (Addendum)
 Physical Therapy Evaluation Patient Details Name: Donald Montoya MRN: [id number] DOB: 03-25-1961 Today's Date: 12/03/2023  History of Present Illness  63 y.o. male who was brought in from [place] after a mechanical fall. xray = L FNF, orthopedics consulted. pt is s/p L femur IM nail on 12/02/23.  PMH: dementia, essential hypertension, hypothyroidism,  Clinical Impression  Pt admitted with above diagnosis.  Pt is cooperative with therapy, follows one step commands with cues/incr time. Pt is able to transfer to chair  with min assist of 2, however gait is limited d/t pt being reluctant to place any wt on LLE d/t pain. Patient will benefit from continued  follow up therapy, <3 hours/day at d/c  Pt coughing excessively after drinking water . RN made aware   Pt currently with functional limitations due to the deficits listed below (see PT Problem List). Pt will benefit from acute skilled PT to increase their independence and safety with mobility to allow discharge.           If plan is discharge home, recommend the following: A little help with walking and/or transfers;Supervision due to cognitive status;A little help with bathing/dressing/bathroom   Can travel by private vehicle   No    Equipment Recommendations Other (comment)  Recommendations for Other Services       Functional Status Assessment Patient has had a recent decline in their functional status and demonstrates the ability to make significant improvements in function in a reasonable and predictable amount of time.     Precautions / Restrictions Precautions Precautions: Fall Restrictions Weight Bearing Restrictions Per Provider Order: No LLE Weight Bearing Per Provider Order: Weight bearing as tolerated      Mobility  Bed Mobility Overal bed mobility: Needs Assistance Bed Mobility: Supine to Sit     Supine to sit: Min assist, +2 for safety/equipment, Mod assist     General bed mobility comments: assist  to  progress LEs off bed and elevate trunk with hand hold to guide to upright    Transfers Overall transfer level: Needs assistance Equipment used: Rolling walker (2 wheels) Transfers: Sit to/from Stand, Bed to chair/wheelchair/BSC Sit to Stand: From elevated surface, Min assist, +2 physical assistance, +2 safety/equipment   Step pivot transfers: Min assist, +2 physical assistance, +2 safety/equipment       General transfer comment: cues for hand placement and safety; assist to balance for SPT, pt hops and is unable to WB LLE d/t pain    Ambulation/Gait               General Gait Details: a few steps bed to chair only  Stairs            Wheelchair Mobility     Tilt Bed    Modified Rankin (Stroke Patients Only)       Balance Overall balance assessment: Needs assistance, History of Falls Sitting-balance support: Feet supported, Single extremity supported Sitting balance-Leahy Scale: Fair     Standing balance support: During functional activity, Reliant on assistive device for balance Standing balance-Leahy Scale: Poor Standing balance comment: reliant on device and external assist                             Pertinent Vitals/Pain Pain Assessment Pain Assessment: Faces Faces Pain Scale: Hurts even more Pain Location: L hip with extension Pain Descriptors / Indicators: Discomfort, Grimacing, Operative site guarding Pain Intervention(s): Limited activity within patient's tolerance, Monitored during session,  Premedicated before session, Repositioned    Home Living Family/patient expects to be discharged to:: Skilled nursing facility                   Additional Comments: pt is from Memory care; pt states he walks "without anything"; pt is not a reliable hsitorian d/t dementia    Prior Function Prior Level of Function : Patient poor historian/Family not available                     Extremity/Trunk Assessment   Upper  Extremity Assessment Upper Extremity Assessment: Defer to OT evaluation    Lower Extremity Assessment Lower Extremity Assessment: Generalized weakness;LLE deficits/detail;RLE deficits/detail RLE Deficits / Details: apears grossly WFL, difficult to test d/t cognition LLE Deficits / Details: L hip in end range flexion on arrival. pt is able to extend with tactile cues to 30 degress flexion; strength at least 2+/5 LLE: Unable to fully assess due to pain       Communication   Communication Communication: No apparent difficulties Cueing Techniques: Verbal cues;Tactile cues;Visual cues;Gestural cues  Cognition Arousal: Alert Behavior During Therapy: Flat affect, Restless Overall Cognitive Status: History of cognitive impairments - at baseline                                 General Comments: follows one step commands consistenly with incr time,cues        General Comments      Exercises     Assessment/Plan    PT Assessment Patient needs continued PT services  PT Problem List Decreased strength;Decreased activity tolerance;Decreased balance;Decreased knowledge of use of DME;Decreased mobility;Pain;Decreased cognition       PT Treatment Interventions DME instruction;Therapeutic exercise;Gait training;Functional mobility training;Therapeutic activities;Patient/family education    PT Goals (Current goals can be found in the Care Plan section)  Acute Rehab PT Goals PT Goal Formulation: Patient unable to participate in goal setting Time For Goal Achievement: 12/17/23 Potential to Achieve Goals: Good    Frequency Min 1X/week     Co-evaluation               AM-PAC PT "6 Clicks" Mobility  Outcome Measure Help needed turning from your back to your side while in a flat bed without using bedrails?: A Lot Help needed moving from lying on your back to sitting on the side of a flat bed without using bedrails?: A Lot Help needed moving to and from a bed to a  chair (including a wheelchair)?: A Lot Help needed standing up from a chair using your arms (e.g., wheelchair or bedside chair)?: A Lot Help needed to walk in hospital room?: Total Help needed climbing 3-5 steps with a railing? : Total 6 Click Score: 10    End of Session Equipment Utilized During Treatment: Gait belt Activity Tolerance: Patient tolerated treatment well;Patient limited by pain Patient left: with call bell/phone within reach;in chair;with chair alarm set Nurse Communication: Mobility status PT Visit Diagnosis: Other abnormalities of gait and mobility (R26.89)    Time: 1610-9604 PT Time Calculation (min) (ACUTE ONLY): 12 min   Charges:   PT Evaluation $PT Eval Low Complexity: 1 Low   PT General Charges $$ ACUTE PT VISIT: 1 Visit         Khalidah Herbold, PT  Acute Rehab Dept [facility]) [phone]  12/03/2023   [facility] 12/03/2023, 11:40 AM

## 2023-12-03 NOTE — Progress Notes (Signed)
 Carotid artery duplex has been completed. Preliminary results can be found in CV Proc through chart review.   12/03/23 2:26 PM Birda Buffy RVT

## 2023-12-03 NOTE — TOC Initial Note (Signed)
 Transition of Care [facility]) - Initial/Assessment Note    Patient Details  Name: Donald Montoya MRN: [id number] Date of Birth: [birth year]  [facility] CM/SW Contact:    Bari Leys, RN Phone Number: 12/03/2023, 11:03 AM  Clinical Narrative:  Call to [facility], sw  Crystal, confirmed patient is Memory Care resident, discussed dc planning, Crystal request notification if patient dc ol SNF, reports she will visit SNF after 2 week stay to assess for pt's baseline to return to [facility]. PT eval, awaiting recommendation.                   Patient Goals and CMS Choice            Expected Discharge Plan and Services                                              Prior Living Arrangements/Services                       Activities of Daily Living   ADL Screening (condition at time of admission) Independently performs ADLs?: No Does the patient have a NEW difficulty with bathing/dressing/toileting/self-feeding that is expected to last >3 days?: No Does the patient have a NEW difficulty with getting in/out of bed, walking, or climbing stairs that is expected to last >3 days?: No Does the patient have a NEW difficulty with communication that is expected to last >3 days?: No Is the patient deaf or have difficulty hearing?: Yes Does the patient have difficulty seeing, even when wearing glasses/contacts?: Yes Does the patient have difficulty concentrating, remembering, or making decisions?: Yes  Permission Sought/Granted                  Emotional Assessment              Admission diagnosis:  Left displaced femoral neck fracture (HCC) [S72.002A] Closed fracture of left hip, initial encounter (HCC) [S72.002A] Fall, initial encounter [W19.XXXA] Patient Active Problem List   Diagnosis Date Noted   Fall at home, initial encounter 12/01/2023   Left displaced femoral neck fracture (HCC) 12/01/2023   Hypothyroidism 12/01/2023    Sepsis (HCC) 06/18/2017   AKI (acute kidney injury) (HCC) 06/18/2017   Hypernatremia 06/18/2017   Near syncope 10/13/2015   Bradycardia 10/13/2015   Hypertension 10/13/2015   Dementia (HCC) 10/13/2015   Syncope, cardiogenic 10/13/2015   PCP:  Georgiana Kirks, MD Pharmacy:   [facility] - [place], [place] - [address] [place] [address] Phone: [phone] Fax: [phone]     Social Drivers of Health (SDOH) Social History: SDOH Screenings   Food Insecurity: Patient Unable To Answer (12/01/2023)  Housing: Patient Unable To Answer (12/01/2023)  Transportation Needs: Patient Unable To Answer (12/01/2023)  Utilities: Patient Unable To Answer (12/01/2023)  Social Connections: Unknown (12/01/2023)  Tobacco Use: Low Risk  (12/01/2023)   SDOH Interventions:     Readmission Risk Interventions     No data to display

## 2023-12-04 ENCOUNTER — Encounter (HOSPITAL_COMMUNITY): Payer: Self-pay | Admitting: Internal Medicine

## 2023-12-04 DIAGNOSIS — S72002A Fracture of unspecified part of neck of left femur, initial encounter for closed fracture: Secondary | ICD-10-CM | POA: Diagnosis not present

## 2023-12-04 LAB — CBC
HCT: 30.6 % — ABNORMAL LOW (ref 39.0–52.0)
Hemoglobin: 10.1 g/dL — ABNORMAL LOW (ref 13.0–17.0)
MCH: 29.2 pg (ref 26.0–34.0)
MCHC: 33 g/dL (ref 30.0–36.0)
MCV: 88.4 fL (ref 80.0–100.0)
Platelets: 181 10*3/uL (ref 150–400)
RBC: 3.46 MIL/uL — ABNORMAL LOW (ref 4.22–5.81)
RDW: 13.2 % (ref 11.5–15.5)
WBC: 10 10*3/uL (ref 4.0–10.5)
nRBC: 0 % (ref 0.0–0.2)

## 2023-12-04 LAB — LIPID PANEL
Cholesterol: 94 mg/dL (ref 0–200)
HDL: 39 mg/dL — ABNORMAL LOW (ref >40)
LDL Cholesterol: 36 mg/dL (ref 0–99)
Total CHOL/HDL Ratio: 2.4 {ratio}
Triglycerides: 96 mg/dL (ref <150)
VLDL: 19 mg/dL (ref 0–40)

## 2023-12-04 LAB — BASIC METABOLIC PANEL
Anion gap: 7 (ref 5–15)
BUN: 19 mg/dL (ref 8–23)
CO2: 31 mmol/L (ref 22–32)
Calcium: 8.4 mg/dL — ABNORMAL LOW (ref 8.9–10.3)
Chloride: 99 mmol/L (ref 98–111)
Creatinine, Ser: 0.61 mg/dL (ref 0.61–1.24)
GFR, Estimated: >60 mL/min (ref >60)
Glucose, Bld: 86 mg/dL (ref 70–99)
Potassium: 4.1 mmol/L (ref 3.5–5.1)
Sodium: 137 mmol/L (ref 135–145)

## 2023-12-04 NOTE — TOC Progression Note (Addendum)
Transition of Care [facility]) - Progression Note    Patient Details  Name: Donald Montoya MRN: [id number] Date of Birth: Nov 29, 1960  Transition [facility]) CM/SW Contact  Howell Rucks, RN Phone Number: 12/04/2023, 11:32 AM  Clinical Narrative:  PT eval completed, recommendation for short term rehab/SNF. Pt with documented hx of Dementia. NCM call to pt's sister, Donald Montoya, left vm with NCM name and phone number requesting call back.    -12:39pm Call from patient's sister, Donald Montoya, introduced self and role of TOC/NCM, reviewed PT recommendation for short term rehab/SNF, agreeable, no preference. [name] request bed offer list be emailed to her at: [email or web address].  -1:52pm FL2 updated, faxed out for bed offers.          Expected Discharge Plan and Services                                               Social Determinants of Health (SDOH) Interventions SDOH Screenings   Food Insecurity: Patient Unable To Answer (12/01/2023)  Housing: Patient Unable To Answer (12/01/2023)  Transportation Needs: Patient Unable To Answer (12/01/2023)  Utilities: Patient Unable To Answer (12/01/2023)  Social Connections: Unknown (12/01/2023)  Tobacco Use: Low Risk  (12/01/2023)    Readmission Risk Interventions     No data to display

## 2023-12-04 NOTE — Progress Notes (Signed)
Speech Language Pathology Treatment:    Patient Details Name: Donald Montoya MRN: [id number] DOB: 08-Aug-1961 Today's Date: 12/04/2023 Time: 4782-9562 SLP Time Calculation (min) (ACUTE ONLY): 10 min  Assessment / Plan / Recommendation Clinical Impression  Pt seen to assess po tolerance and readiness for dietary advancement.  NT in room feeding pt his lunch including peaches, roast beef and nectar liquids.  SLP assisted NT to slide pt up in bed for optimal posture. He does grimace with HOB elevation, thus SLP used reverse trendelenburg to maximize comfort.    Observed pt consuming peaches and water.  Slightly prolonged mastication but rotary mastication noted.  No clinical indication of aspiration noted. Pt did not pass 3 ounce Yale water screen - as he required rest break after approx 1.5 ounces.   He also has slight abrasion on palate region right - but does not indicate discomfort.  SLP questions if this could be due to intubation for surgery.    Will advance diet to thin liquids but recommend continue dys3 given poor dentition and slight abrasion- as suspect his acute dysphagia was acute due to intubation.  Will follow up briefly for po tolerance.    HPI HPI: Patient is a 63 y.o. male with PMH: dementia, essential HTN, hypothyroidism who presented to the hospital from his SNF after falling out of his WC on left side. He has prior h/o syncopal episodes and fall. He underwent a left hip trochanteric femoral nailing on 12/02/23. On 2/10, he was observed by PT and nursing to be coughing excessively when drinking liquids, prompting SLP swallow evaluation.      SLP Plan  Continue with current plan of care      Recommendations for follow up therapy are one component of a multi-disciplinary discharge planning process, led by the attending physician.  Recommendations may be updated based on patient status, additional functional criteria and insurance authorization.    Recommendations  Diet  recommendations: Dysphagia 3 (mechanical soft);Thin liquid Liquids provided via: Cup;Straw Medication Administration: Whole meds with puree Supervision: Full supervision/cueing for compensatory strategies Compensations: Slow rate;Small sips/bites;Minimize environmental distractions Postural Changes and/or Swallow Maneuvers: Seated upright 90 degrees                  Oral care BID   Frequent or constant Supervision/Assistance Dysphagia, unspecified (R13.10)     Continue with current plan of care    Rolena Infante, MS [facility] SLP Acute Rehab Services Office [phone]  Chales Abrahams  12/04/2023, 1:00 PM

## 2023-12-04 NOTE — Progress Notes (Addendum)
     Subjective:  Patient awake.  No complaints from what I can tell.    Objective:   VITALS:   Vitals:   12/03/23 1319 12/03/23 2304 12/04/23 0542 12/04/23 1313  BP: 131/74 116/64 123/67 114/72  Pulse: 80 67 72 78  Resp: 16 18 18 17   Temp: 98.6 F (37 C) 98.9 F (37.2 C) 97.9 F (36.6 C) 98.1 F (36.7 C)  TempSrc:  Oral Oral   SpO2: 100% 96% 93% 94%  Weight:      Height:        Neurologically intact Incision: no drainage Dressings in place.   Lab Results  Component Value Date   WBC 10.0 12/04/2023   HGB 10.1 (L) 12/04/2023   HCT 30.6 (L) 12/04/2023   MCV 88.4 12/04/2023   PLT 181 12/04/2023   BMET    Component Value Date/Time   NA 137 12/04/2023 0338   K 4.1 12/04/2023 0338   CL 99 12/04/2023 0338   CO2 31 12/04/2023 0338   GLUCOSE 86 12/04/2023 0338   BUN 19 12/04/2023 0338   CREATININE 0.61 12/04/2023 0338   CALCIUM 8.4 (L) 12/04/2023 0338   GFRNONAA >60 12/04/2023 0338     Assessment/Plan: 2 Days Post-Op   Principal Problem:   Left displaced femoral neck fracture (HCC) Active Problems:   Hypertension   Dementia (HCC)   Fall at home, initial encounter   Hypothyroidism  WBAT Ok for anticoagulation, plan for lovenox for 4 weeks post op Tramadol or tylenol for pain, rx for tramadol printed and signed. RTC 2 weeks with me for followup  Will sign off, please call with questions.   Eulas Post 12/04/2023, 4:00 PM   Teryl Lucy, MD Cell [phone]

## 2023-12-04 NOTE — Progress Notes (Signed)
PROGRESS NOTE    Donald Montoya  VHQ:[id number] DOB: 1961-01-12 DOA: 12/01/2023 PCP: Ron Parker, MD    Brief Narrative:   Donald Montoya is a 63 y.o. male with past medical history significant for dementia, HTN, HLD, hypothyroidism who presented to [facility] ED on 11/30/18/2025 from [facility] memory care unit following mechanical fall.  Patient apparently fell out of wheelchair in which he landed on his left side with immediate pain.  Denies striking his head and no syncope or loss of consciousness.  Not on a blood thinner.  EMS was activated and patient was brought to the ED for further evaluation management.  In the ED, temperature 97.6 F, HR 62, RR 16, BP 142/72, SpO2 95% on room air.  WBC 6.3, hemoglobin 13.1, platelet count 224.  Sodium 143, potassium 4.2, chloride 100, CO2 32, glucose 98, BUN 20, creatinine 0.93.  INR 1.0.  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  CT C-spine without contrast with degenerative changes, no acute bony abnormality.  Chest x-ray with no active cardiopulmonary disease process.  Left hip/pelvis x-ray with left femoral neck fracture.  Orthopedics was consulted with plan for surgical repair of his hip fracture.  TRH consulted for further evaluation management of left femoral neck fracture.  Assessment & Plan:   Displaced left femoral neck fracture Patient presenting from nursing facility after sustaining mechanical fall out of his wheelchair in which he landed on his left side with immediate pain.  Imaging on admission notable for left femoral neck fracture.  Orthopedics was consulted and patient underwent left hip trochanteric femoral nailing by Dr. Dion Saucier on 12/02/2023. -- Orthopedics following, appreciate assistance -- WBAT LLE -- Lovenox 40 mg [place] daily for postoperative DVT prophylaxis --  Tylenol 325-650 mg q6h PRN mild pain -- Oxycodone 5 to 10 mg p.o. q6h PRN moderate pain -- Robaxin 5 mg p.o. q6h PRN muscle spasms -- Senna 8.6 mg BID -- Colace 100mg  PO BID -- Miralax PO daily -- Biscodyl supp 10mg  daily PRN moderate constipation -- PT/OT recommending SNF placement, TOC consulted for assistance -- Outpatient with orthopedics, Dr. Dion Saucier 2 weeks for wound check  Essential hypertension -- Metoprolol succinate 25 mg p.o. twice daily  Hypothyroidism -- Levothyroxine 25 mcg p.o. daily  Hyperlipidemia -- Atorvastatin 10 mg p.o. daily  Iron deficiency anemia -- Ferrous sulfate 325 mg p.o. 3 times daily  Dementia -- Delirium precautions -- Get up during the day -- Encourage a familiar face to remain present throughout the day -- Keep blinds open and lights on during daylight hours -- Minimize the use of opioids/benzodiazepines -- Depakote 750 mg p.o. nightly -- From [facility] memory care unit   DVT prophylaxis: enoxaparin (LOVENOX) injection 40 mg Start: 12/03/23 0800 SCDs Start: 12/02/23 1244    Code Status: Full Code Family Communication: No family present at bedside this morning  Disposition Plan:  Level of care: Med-Surg Status is: Inpatient Remains inpatient appropriate because: Pending SNF placement, medically stable for discharge once bed available    Consultants:  Orthopedics, Dr. Dion Saucier  Procedures:   left hip trochanteric femoral nailing by Dr. Dion Saucier on 12/02/2023.  Antimicrobials:  Perioperative cefazolin 2/9 - 2/9   Subjective: Patient seen examined bedside, lying in bed.  Watching TV.  No family present.  Pleasantly confused.  Poorly interactive with conversation.  Appears to be without distress/pain.  No acute  events overnight per nurse staff.  Awaiting SNF placement, medically stable for discharge once bed available.  Objective: Vitals:   12/03/23 1319 12/03/23 2304 12/04/23 0542 12/04/23 1313  BP: 131/74 116/64 123/67 114/72   Pulse: 80 67 72 78  Resp: 16 18 18 17   Temp: 98.6 F (37 C) 98.9 F (37.2 C) 97.9 F (36.6 C) 98.1 F (36.7 C)  TempSrc:  Oral Oral   SpO2: 100% 96% 93% 94%  Weight:      Height:        Intake/Output Summary (Last 24 hours) at 12/04/2023 1429 Last data filed at 12/04/2023 1213 Gross per 24 hour  Intake 680 ml  Output --  Net 680 ml   Filed Weights   12/01/23 1740  Weight: 87 kg    Examination:  Physical Exam: GEN: NAD, alert, pleasantly confused HEENT: NCAT, PERRL, EOMI, sclera clear, MMM PULM: CTAB w/o wheezes/crackles, normal respiratory effort, on room air CV: RRR w/o M/G/R GI: abd soft, NTND, + BS MSK: no peripheral edema, moves all extremities independently, surgical incision site noted with dressing in place, clean/dry/intact NEURO: No focal neurological deficits Integumentary: Surgical incision site as above, otherwise no concerning rashes/lesions/wounds noted on exposed skin surfaces    Data Reviewed: I have personally reviewed following labs and imaging studies  CBC: Recent Labs  Lab 12/01/23 1957 12/02/23 0333 12/02/23 1310 12/03/23 0302 12/04/23 0338  WBC 6.3 9.1 8.1 9.4 10.0  HGB 13.1 12.6* 10.7* 9.9* 10.1*  HCT 39.6 38.9* 33.1* 29.9* 30.6*  MCV 89.4 89.0 89.2 89.0 88.4  PLT 224 214 173 171 181   Basic Metabolic Panel: Recent Labs  Lab 12/01/23 1957 12/02/23 0333 12/02/23 1310 12/03/23 0302 12/04/23 0338  NA 143 144  --  137 137  K 4.2 3.7  --  3.7 4.1  CL 100 101  --  99 99  CO2 32 31  --  30 31  GLUCOSE 98 133*  --  102* 86  BUN 20 19  --  19 19  CREATININE 0.93 0.78 0.81 0.75 0.61  CALCIUM 9.3 9.1  --  8.1* 8.4*   GFR: Estimated Creatinine Clearance: 108.5 mL/min (by C-G formula based on SCr of 0.61 mg/dL). Liver Function Tests: Recent Labs  Lab 12/02/23 0333  AST 30  ALT 22  ALKPHOS 99  BILITOT 0.7  PROT 6.7  ALBUMIN 3.5   No results for input(s): "LIPASE", "AMYLASE" in the last 168 hours. No results for input(s):  "AMMONIA" in the last 168 hours. Coagulation Profile: Recent Labs  Lab 12/01/23 2005  INR 1.0   Cardiac Enzymes: No results for input(s): "CKTOTAL", "CKMB", "CKMBINDEX", "TROPONINI" in the last 168 hours. BNP (last 3 results) No results for input(s): "PROBNP" in the last 8760 hours. HbA1C: No results for input(s): "HGBA1C" in the last 72 hours. CBG: No results for input(s): "GLUCAP" in the last 168 hours. Lipid Profile: Recent Labs    12/04/23 0338  CHOL 94  HDL 39*  LDLCALC 36  TRIG 96  CHOLHDL 2.4   Thyroid Function Tests: Recent Labs    12/03/23 1407  TSH 0.490   Anemia Panel: Recent Labs    12/03/23 1407  VITAMINB12 375   Sepsis Labs: No results for input(s): "PROCALCITON", "LATICACIDVEN" in the last 168 hours.  Recent Results (from the past 240 hours)  Surgical PCR screen     Status: None   Collection Time: 12/02/23  4:29 AM   Specimen: Nasal Mucosa; Nasal Swab  Result Value  Ref Range Status   MRSA, PCR NEGATIVE NEGATIVE Final   Staphylococcus aureus NEGATIVE NEGATIVE Final    Comment: (NOTE) The Xpert SA Assay (FDA approved for NASAL specimens in patients 71 years of age and older), is one component of a comprehensive surveillance program. It is not intended to diagnose infection nor to guide or monitor treatment. Performed at [facility], [address], [place], [place] [address]          Radiology Studies: VAS US CAROTID Result Date: 12/03/2023 Carotid Arterial Duplex Study Patient Name:  Donald Montoya  Date of Exam:   12/03/2023 Medical Rec #: [id number]           Accession #:    [id number] Date of Birth: July 06, 1961           Patient Gender: M Patient Age:   51 years Exam Location:  [facility] Procedure:      VAS US CAROTID Referring Phys: Shonna Chock --------------------------------------------------------------------------------  Indications:       History of CVA (cerebrovascular accident) [[id number]]. Risk Factors:       Hypertension. Limitations        Today's exam was limited due to the patient's inability or                    unwillingness to cooperate, the patient's respiratory                    variation and constant patient movement. Comparison Study:  No prior studies. Performing Technologist: Chanda Busing RVT  Examination Guidelines: A complete evaluation includes B-mode imaging, spectral Doppler, color Doppler, and power Doppler as needed of all accessible portions of each vessel. Bilateral testing is considered an integral part of a complete examination. Limited examinations for reoccurring indications may be performed as noted.  Right Carotid Findings: +----------+--------+--------+--------+-----------------------+--------+           PSV cm/sEDV cm/sStenosisPlaque Description     Comments +----------+--------+--------+--------+-----------------------+--------+ CCA Prox  103     26              smooth and heterogenoustortuous +----------+--------+--------+--------+-----------------------+--------+ CCA Distal78      18              smooth and heterogenous         +----------+--------+--------+--------+-----------------------+--------+ ICA Prox  71      26              smooth and heterogenous         +----------+--------+--------+--------+-----------------------+--------+ ICA Mid   83      35                                     tortuous +----------+--------+--------+--------+-----------------------+--------+ ICA Distal86      40                                     tortuous +----------+--------+--------+--------+-----------------------+--------+ ECA       72      14                                              +----------+--------+--------+--------+-----------------------+--------+ +----------+--------+-------+--------+-------------------+  PSV cm/sEDV cmsDescribeArm Pressure (mmHG) +----------+--------+-------+--------+-------------------+  [id number]                                         +----------+--------+-------+--------+-------------------+ +---------+--------+--+--------+--+---------+ VertebralPSV cm/s49EDV cm/s15Antegrade +---------+--------+--+--------+--+---------+  Left Carotid Findings: +----------+--------+--------+--------+-----------------------+--------+           PSV cm/sEDV cm/sStenosisPlaque Description     Comments +----------+--------+--------+--------+-----------------------+--------+ CCA Prox  131     28              smooth and heterogenous         +----------+--------+--------+--------+-----------------------+--------+ CCA Distal103     33              smooth and heterogenous         +----------+--------+--------+--------+-----------------------+--------+ ICA Prox  47      18              smooth and heterogenous         +----------+--------+--------+--------+-----------------------+--------+ ICA Mid   97      35                                              +----------+--------+--------+--------+-----------------------+--------+ ICA Distal68      21                                     tortuous +----------+--------+--------+--------+-----------------------+--------+ ECA       81      15                                              +----------+--------+--------+--------+-----------------------+--------+ +----------+--------+--------+--------+-------------------+           PSV cm/sEDV cm/sDescribeArm Pressure (mmHG) +----------+--------+--------+--------+-------------------+ [id number]                                          +----------+--------+--------+--------+-------------------+ +---------+--------+--+--------+--+---------+ VertebralPSV cm/s35EDV cm/s14Antegrade +---------+--------+--+--------+--+---------+   Summary: Right Carotid: Velocities in the right ICA are consistent with a 1-39% stenosis. Left Carotid: Velocities in the left ICA are  consistent with a 1-39% stenosis. Vertebrals: Bilateral vertebral arteries demonstrate antegrade flow. *See table(s) above for measurements and observations.  Electronically signed by Carolynn Sayers on 12/03/2023 at 5:00:12 PM.    Final         Scheduled Meds:  aspirin EC  81 mg Oral Daily   atorvastatin  10 mg Oral QHS   divalproex  750 mg Oral QHS   docusate sodium  100 mg Oral BID   enoxaparin (LOVENOX) injection  40 mg Subcutaneous Q24H   feeding supplement  237 mL Oral BID BM   ferrous sulfate  325 mg Oral TID PC   levothyroxine  25 mcg Oral Q0600   metoprolol succinate  25 mg Oral BID   polyethylene glycol  34 g Oral Daily   senna  1 tablet Oral BID   Continuous Infusions:   LOS: 3 days    Time spent: 51 minutes spent on chart review, discussion with nursing staff, consultants, updating  family and interview/physical exam; more than 50% of that time was spent in counseling and/or coordination of care.    Alvira Philips [place], DO [facility] Available via Epic secure chat 7am-7pm After these hours, please refer to coverage provider listed on [email or web address] 12/04/2023, 2:29 PM

## 2023-12-04 NOTE — [place] FL2 (Signed)
[place] MEDICAID FL2 LEVEL OF CARE FORM     IDENTIFICATION  Patient Name: Donald Montoya Birthdate: 06-22-1961 Sex: male Admission Date (Current Location): 12/01/2023  [facility] and [place] Number:  Producer, television/film/video and Address:  [facility],  [address] [place], [place] [address]      Provider Number: [id number]  Attending Physician Name and Address:  [place], Eric J, DO  Relative Name and Phone Number:  Eugenie Norrie (Sister)  [phone] [facility] Home Phone)    Current Level of Care: Hospital Recommended Level of Care: Skilled Nursing Facility Prior Approval Number:    Date Approved/Denied:   PASRR Number: [id number]  Discharge Plan: SNF    Current Diagnoses: Patient Active Problem List   Diagnosis Date Noted   Fall at home, initial encounter 12/01/2023   Left displaced femoral neck fracture (HCC) 12/01/2023   Hypothyroidism 12/01/2023   Sepsis (HCC) 06/18/2017   AKI (acute kidney injury) (HCC) 06/18/2017   Hypernatremia 06/18/2017   Near syncope 10/13/2015   Bradycardia 10/13/2015   Hypertension 10/13/2015   Dementia (HCC) 10/13/2015   Syncope, cardiogenic 10/13/2015    Orientation RESPIRATION BLADDER Height & Weight     Self, Time, Situation, Place  Normal Incontinent Weight: 87 kg Height:  6\' 2"  (188 cm)  BEHAVIORAL SYMPTOMS/MOOD NEUROLOGICAL BOWEL NUTRITION STATUS      Continent Diet  AMBULATORY STATUS COMMUNICATION OF NEEDS Skin   Extensive Assist Verbally Normal                       Personal Care Assistance Level of Assistance  Bathing, Feeding, Dressing Bathing Assistance: Limited assistance Feeding assistance: Limited assistance Dressing Assistance: Limited assistance     Functional Limitations Info  Sight, Hearing, Speech Sight Info: Adequate Hearing Info: Adequate Speech Info: Adequate    SPECIAL CARE FACTORS FREQUENCY  PT (By licensed PT), OT (By licensed OT)     PT Frequency: 5x/wk OT Frequency: 5x/wk             Contractures Contractures Info: Not present    Additional Factors Info  Code Status, Allergies, Psychotropic Code Status Info: Full Code Allergies Info: NKA Psychotropic Info: N/A         Current Medications (12/04/2023):  This is the current hospital active medication list Current Facility-Administered Medications  Medication Dose Route Frequency Provider Last Rate Last Admin   acetaminophen (TYLENOL) tablet 325-650 mg  325-650 mg Oral Q6H PRN Teryl Lucy, MD   650 mg at 12/04/23 1054   alum & mag hydroxide-simeth (MAALOX/MYLANTA) 200-200-20 MG/5ML suspension 30 mL  30 mL Oral Q4H PRN Teryl Lucy, MD       aspirin EC tablet 81 mg  81 mg Oral Daily Kathrynn Running, MD   81 mg at 12/04/23 1054   atorvastatin (LIPITOR) tablet 10 mg  10 mg Oral QHS Teryl Lucy, MD   10 mg at 12/03/23 2145   bisacodyl (DULCOLAX) suppository 10 mg  10 mg Rectal Daily PRN Teryl Lucy, MD       divalproex (DEPAKOTE ER) 24 hr tablet 750 mg  750 mg Oral QHS Teryl Lucy, MD   750 mg at 12/03/23 2145   docusate sodium (COLACE) capsule 100 mg  100 mg Oral BID Teryl Lucy, MD   100 mg at 12/04/23 1054   enoxaparin (LOVENOX) injection 40 mg  40 mg Subcutaneous Q24H Teryl Lucy, MD   40 mg at 12/04/23 1053   feeding supplement (ENSURE ENLIVE / ENSURE PLUS)  liquid 237 mL  237 mL Oral BID BM Teryl Lucy, MD   237 mL at 12/04/23 1054   ferrous sulfate tablet 325 mg  325 mg Oral TID PC Teryl Lucy, MD   325 mg at 12/04/23 1054   levothyroxine (SYNTHROID) tablet 25 mcg  25 mcg Oral Q0600 Teryl Lucy, MD   25 mcg at 12/04/23 0546   magnesium citrate solution 1 Bottle  1 Bottle Oral Once PRN Teryl Lucy, MD       menthol-cetylpyridinium (CEPACOL) lozenge 3 mg  1 lozenge Oral PRN Teryl Lucy, MD       Or   phenol (CHLORASEPTIC) mouth spray 1 spray  1 spray Mouth/Throat PRN Teryl Lucy, MD       methocarbamol (ROBAXIN) tablet 500 mg  500 mg Oral Q6H PRN Teryl Lucy, MD   500  mg at 12/03/23 2145   Or   methocarbamol (ROBAXIN) injection 500 mg  500 mg Intravenous Q6H PRN Teryl Lucy, MD       metoprolol succinate (TOPROL-XL) 24 hr tablet 25 mg  25 mg Oral BID Teryl Lucy, MD   25 mg at 12/04/23 1054   morphine (PF) 2 MG/ML injection 0.5-1 mg  0.5-1 mg Intravenous Q2H PRN Teryl Lucy, MD       ondansetron [facility]) tablet 4 mg  4 mg Oral Q6H PRN Teryl Lucy, MD       Or   ondansetron [facility]) injection 4 mg  4 mg Intravenous Q6H PRN Teryl Lucy, MD       oxyCODONE (Oxy IR/ROXICODONE) immediate release tablet 5-10 mg  5-10 mg Oral Q6H PRN Kathrynn Running, MD   10 mg at 12/04/23 1055   polyethylene glycol (MIRALAX / GLYCOLAX) packet 34 g  34 g Oral Daily Kathrynn Running, MD   34 g at 12/04/23 1054   senna (SENOKOT) tablet 8.6 mg  1 tablet Oral BID Teryl Lucy, MD   8.6 mg at 12/04/23 1055     Discharge Medications: Please see discharge summary for a list of discharge medications.  Relevant Imaging Results:  Relevant Lab Results:   Additional Information SSN: [id number]  Howell Rucks, RN

## 2023-12-05 DIAGNOSIS — S72002A Fracture of unspecified part of neck of left femur, initial encounter for closed fracture: Secondary | ICD-10-CM | POA: Diagnosis not present

## 2023-12-05 NOTE — TOC Progression Note (Signed)
Transition of Care [facility]) - Progression Note    Patient Details  Name: Donald Montoya MRN: [id number] Date of Birth: 08/16/1961  Transition [facility]) CM/SW Contact  Howell Rucks, RN Phone Number: 12/05/2023, 11:36 AM  Clinical Narrative:   Short term rehab/SNF bed offer list [facility], [place], [facility], [address], [place], [address], [place], [place], [place]), emailed to pt's sister, Eugenie Norrie at: [email or web address] per her request, await facility choice.          Expected Discharge Plan and Services                                               Social Determinants of Health (SDOH) Interventions SDOH Screenings   Food Insecurity: Patient Unable To Answer (12/01/2023)  Housing: Patient Unable To Answer (12/01/2023)  Transportation Needs: Patient Unable To Answer (12/01/2023)  Utilities: Patient Unable To Answer (12/01/2023)  Social Connections: Unknown (12/01/2023)  Tobacco Use: Low Risk  (12/01/2023)    Readmission Risk Interventions     No data to display

## 2023-12-05 NOTE — Plan of Care (Signed)
Problem: Activity: Goal: Risk for activity intolerance will decrease Outcome: Progressing

## 2023-12-05 NOTE — Progress Notes (Signed)
PROGRESS NOTE    Donald Montoya  ZOX:[id number] DOB: [birth year] DOA: 12/01/2023 PCP: Ron Parker, MD    Brief Narrative:   Donald Montoya is a 63 y.o. male with past medical history significant for dementia, HTN, HLD, hypothyroidism who presented to [facility] ED on 11/30/18/2025 from [facility] memory care unit following mechanical fall.  Patient apparently fell out of wheelchair in which he landed on his left side with immediate pain.  Denies striking his head and no syncope or loss of consciousness.  Not on a blood thinner.  EMS was activated and patient was brought to the ED for further evaluation management.  In the ED, temperature 97.6 F, HR 62, RR 16, BP 142/72, SpO2 95% on room air.  WBC 6.3, hemoglobin 13.1, platelet count 224.  Sodium 143, potassium 4.2, chloride 100, CO2 32, glucose 98, BUN 20, creatinine 0.93.  INR 1.0.  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  CT C-spine without contrast with degenerative changes, no acute bony abnormality.  Chest x-ray with no active cardiopulmonary disease process.  Left hip/pelvis x-ray with left femoral neck fracture.  Orthopedics was consulted with plan for surgical repair of his hip fracture.  TRH consulted for further evaluation management of left femoral neck fracture.  Assessment & Plan:   Displaced left femoral neck fracture Patient presenting from nursing facility after sustaining mechanical fall out of his wheelchair in which he landed on his left side with immediate pain.  Imaging on admission notable for left femoral neck fracture.  Orthopedics was consulted and patient underwent left hip trochanteric femoral nailing by Dr. Dion Saucier on 12/02/2023. -- Orthopedics following, appreciate assistance -- WBAT LLE -- Lovenox 40 mg [place] daily for postoperative DVT prophylaxis x 4  weeks -- Tylenol 325-650 mg q6h PRN mild pain -- Oxycodone 5 to 10 mg p.o. q6h PRN moderate pain -- Robaxin 5 mg p.o. q6h PRN muscle spasms -- Senna 8.6 mg BID -- Colace 100mg  PO BID -- Miralax PO daily -- Biscodyl supp 10mg  daily PRN moderate constipation -- PT/OT recommending SNF placement, TOC consulted for assistance -- Outpatient with orthopedics, Dr. Dion Saucier 2 weeks for wound check  Essential hypertension -- Metoprolol succinate 25 mg p.o. twice daily  Hypothyroidism -- Levothyroxine 25 mcg p.o. daily  Hyperlipidemia -- Atorvastatin 10 mg p.o. daily  Iron deficiency anemia -- Ferrous sulfate 325 mg p.o. 3 times daily  Dementia -- Delirium precautions -- Get up during the day -- Encourage a familiar face to remain present throughout the day -- Keep blinds open and lights on during daylight hours -- Minimize the use of opioids/benzodiazepines -- Depakote 750 mg p.o. nightly -- From [facility] memory care unit   DVT prophylaxis: enoxaparin (LOVENOX) injection 40 mg Start: 12/03/23 0800 SCDs Start: 12/02/23 1244    Code Status: Full Code Family Communication: No family present at bedside this morning  Disposition Plan:  Level of care: Med-Surg Status is: Inpatient Remains inpatient appropriate because: Pending SNF placement, medically stable for discharge once bed available    Consultants:  Orthopedics, Dr. Dion Saucier  Procedures:   left hip trochanteric femoral nailing by Dr. Dion Saucier on 12/02/2023.  Antimicrobials:  Perioperative cefazolin 2/9 - 2/9   Subjective: Patient seen examined bedside, lying in bed.  No family present.  Pleasantly confused.  Poorly interactive with conversation.  Appears to be without distress/pain.  No acute  events overnight per nurse staff.  Awaiting SNF placement, medically stable for discharge once bed available.  Objective: Vitals:   12/04/23 0542 12/04/23 1313 12/04/23 2214 12/05/23 0637  BP: 123/67 114/72 126/71 120/73   Pulse: 72 78 73 71  Resp: 18 17 18 18   Temp: 97.9 F (36.6 C) 98.1 F (36.7 C) 98.3 F (36.8 C) 98.2 F (36.8 C)  TempSrc: Oral  Oral Oral  SpO2: 93% 94% 96% 96%  Weight:      Height:        Intake/Output Summary (Last 24 hours) at 12/05/2023 1123 Last data filed at 12/05/2023 1610 Gross per 24 hour  Intake 480 ml  Output 1 ml  Net 479 ml   Filed Weights   12/01/23 1740  Weight: 87 kg    Examination:  Physical Exam: GEN: NAD, alert, pleasantly confused HEENT: NCAT, PERRL, EOMI, sclera clear, MMM, poor dentition PULM: CTAB w/o wheezes/crackles, normal respiratory effort, on room air CV: RRR w/o M/G/R GI: abd soft, NTND, + BS MSK: no peripheral edema, moves all extremities independently, surgical incision site noted with dressing in place, clean/dry/intact NEURO: No focal neurological deficits Integumentary: Surgical incision site as above, otherwise no concerning rashes/lesions/wounds noted on exposed skin surfaces    Data Reviewed: I have personally reviewed following labs and imaging studies  CBC: Recent Labs  Lab 12/01/23 1957 12/02/23 0333 12/02/23 1310 12/03/23 0302 12/04/23 0338  WBC 6.3 9.1 8.1 9.4 10.0  HGB 13.1 12.6* 10.7* 9.9* 10.1*  HCT 39.6 38.9* 33.1* 29.9* 30.6*  MCV 89.4 89.0 89.2 89.0 88.4  PLT 224 214 173 171 181   Basic Metabolic Panel: Recent Labs  Lab 12/01/23 1957 12/02/23 0333 12/02/23 1310 12/03/23 0302 12/04/23 0338  NA 143 144  --  137 137  K 4.2 3.7  --  3.7 4.1  CL 100 101  --  99 99  CO2 32 31  --  30 31  GLUCOSE 98 133*  --  102* 86  BUN 20 19  --  19 19  CREATININE 0.93 0.78 0.81 0.75 0.61  CALCIUM 9.3 9.1  --  8.1* 8.4*   GFR: Estimated Creatinine Clearance: 108.5 mL/min (by C-G formula based on SCr of 0.61 mg/dL). Liver Function Tests: Recent Labs  Lab 12/02/23 0333  AST 30  ALT 22  ALKPHOS 99  BILITOT 0.7  PROT 6.7  ALBUMIN 3.5   No results for input(s): "LIPASE", "AMYLASE" in the last 168 hours. No  results for input(s): "AMMONIA" in the last 168 hours. Coagulation Profile: Recent Labs  Lab 12/01/23 2005  INR 1.0   Cardiac Enzymes: No results for input(s): "CKTOTAL", "CKMB", "CKMBINDEX", "TROPONINI" in the last 168 hours. BNP (last 3 results) No results for input(s): "PROBNP" in the last 8760 hours. HbA1C: No results for input(s): "HGBA1C" in the last 72 hours. CBG: No results for input(s): "GLUCAP" in the last 168 hours. Lipid Profile: Recent Labs    12/04/23 0338  CHOL 94  HDL 39*  LDLCALC 36  TRIG 96  CHOLHDL 2.4   Thyroid Function Tests: Recent Labs    12/03/23 1407  TSH 0.490   Anemia Panel: Recent Labs    12/03/23 1407  VITAMINB12 375   Sepsis Labs: No results for input(s): "PROCALCITON", "LATICACIDVEN" in the last 168 hours.  Recent Results (from the past 240 hours)  Surgical PCR screen     Status: None   Collection Time: 12/02/23  4:29 AM   Specimen: Nasal Mucosa; Nasal Swab  Result Value Ref Range Status   MRSA, PCR NEGATIVE NEGATIVE Final   Staphylococcus aureus NEGATIVE NEGATIVE Final    Comment: (NOTE) The Xpert SA Assay (FDA approved for NASAL specimens in patients 39 years of age and older), is one component of a comprehensive surveillance program. It is not intended to diagnose infection nor to guide or monitor treatment. Performed at [facility], [address] [place], [place] [address]          Radiology Studies: VAS US CAROTID Result Date: 12/03/2023 Carotid Arterial Duplex Study Patient Name:  GREGORY BARRICK  Date of Exam:   12/03/2023 Medical Rec #: [id number]           Accession #:    [id number] Date of Birth: 1961/06/21           Patient Gender: M Patient Age:   12 years Exam Location:  [facility] Procedure:      VAS US CAROTID Referring Phys: Shonna Chock --------------------------------------------------------------------------------  Indications:       History of CVA (cerebrovascular accident)  [[id number]]. Risk Factors:      Hypertension. Limitations        Today's exam was limited due to the patient's inability or                    unwillingness to cooperate, the patient's respiratory                    variation and constant patient movement. Comparison Study:  No prior studies. Performing Technologist: Chanda Busing RVT  Examination Guidelines: A complete evaluation includes B-mode imaging, spectral Doppler, color Doppler, and power Doppler as needed of all accessible portions of each vessel. Bilateral testing is considered an integral part of a complete examination. Limited examinations for reoccurring indications may be performed as noted.  Right Carotid Findings: +----------+--------+--------+--------+-----------------------+--------+           PSV cm/sEDV cm/sStenosisPlaque Description     Comments +----------+--------+--------+--------+-----------------------+--------+ CCA Prox  103     26              smooth and heterogenoustortuous +----------+--------+--------+--------+-----------------------+--------+ CCA Distal78      18              smooth and heterogenous         +----------+--------+--------+--------+-----------------------+--------+ ICA Prox  71      26              smooth and heterogenous         +----------+--------+--------+--------+-----------------------+--------+ ICA Mid   83      35                                     tortuous +----------+--------+--------+--------+-----------------------+--------+ ICA Distal86      40                                     tortuous +----------+--------+--------+--------+-----------------------+--------+ ECA       72      14                                              +----------+--------+--------+--------+-----------------------+--------+ +----------+--------+-------+--------+-------------------+  PSV cm/sEDV cmsDescribeArm Pressure (mmHG)  +----------+--------+-------+--------+-------------------+ [id number]                                         +----------+--------+-------+--------+-------------------+ +---------+--------+--+--------+--+---------+ VertebralPSV cm/s49EDV cm/s15Antegrade +---------+--------+--+--------+--+---------+  Left Carotid Findings: +----------+--------+--------+--------+-----------------------+--------+           PSV cm/sEDV cm/sStenosisPlaque Description     Comments +----------+--------+--------+--------+-----------------------+--------+ CCA Prox  131     28              smooth and heterogenous         +----------+--------+--------+--------+-----------------------+--------+ CCA Distal103     33              smooth and heterogenous         +----------+--------+--------+--------+-----------------------+--------+ ICA Prox  47      18              smooth and heterogenous         +----------+--------+--------+--------+-----------------------+--------+ ICA Mid   97      35                                              +----------+--------+--------+--------+-----------------------+--------+ ICA Distal68      21                                     tortuous +----------+--------+--------+--------+-----------------------+--------+ ECA       81      15                                              +----------+--------+--------+--------+-----------------------+--------+ +----------+--------+--------+--------+-------------------+           PSV cm/sEDV cm/sDescribeArm Pressure (mmHG) +----------+--------+--------+--------+-------------------+ [id number]                                          +----------+--------+--------+--------+-------------------+ +---------+--------+--+--------+--+---------+ VertebralPSV cm/s35EDV cm/s14Antegrade +---------+--------+--+--------+--+---------+   Summary: Right Carotid: Velocities in the right ICA are consistent with a  1-39% stenosis. Left Carotid: Velocities in the left ICA are consistent with a 1-39% stenosis. Vertebrals: Bilateral vertebral arteries demonstrate antegrade flow. *See table(s) above for measurements and observations.  Electronically signed by Carolynn Sayers on 12/03/2023 at 5:00:12 PM.    Final         Scheduled Meds:  aspirin EC  81 mg Oral Daily   atorvastatin  10 mg Oral QHS   divalproex  750 mg Oral QHS   docusate sodium  100 mg Oral BID   enoxaparin (LOVENOX) injection  40 mg Subcutaneous Q24H   feeding supplement  237 mL Oral BID BM   ferrous sulfate  325 mg Oral TID PC   levothyroxine  25 mcg Oral Q0600   metoprolol succinate  25 mg Oral BID   polyethylene glycol  34 g Oral Daily   senna  1 tablet Oral BID   Continuous Infusions:   LOS: 4 days    Time spent: 45 minutes spent on chart review, discussion with nursing staff, consultants, updating  family and interview/physical exam; more than 50% of that time was spent in counseling and/or coordination of care.    Alvira Philips [place], DO [facility] Available via Epic secure chat 7am-7pm After these hours, please refer to coverage provider listed on [email or web address] 12/05/2023, 11:23 AM

## 2023-12-05 NOTE — Progress Notes (Signed)
Physical Therapy Treatment Patient Details Name: Donald Montoya MRN: [id number] DOB: April 21, 1961 Today's Date: 12/05/2023   History of Present Illness 63 y.o. male who was brought in from [place] after a mechanical fall. xray = L FNF, orthopedics consulted. pt is s/p L femur IM nail on 12/02/23.  PMH: dementia, essential hypertension, hypothyroidism,    PT Comments  Pt cooperative with PT, progressing steadily. Amb ~ 10' with RW and +2 min assist, pt closing eyes after 10' and nodded "yes" to being tired. Chair pulled to pt for seated rest.  With multi-modal cues pt able to lightly WB on LLE however remains limited by pain and muscle guarding.  Encouraged LE extension prior to sitting and once seated in recliner as pt tends to keep bil LEs in end range knee and hip flexion.  D/c plan remains appropriate, continue PT in acute setting   If plan is discharge home, recommend the following: A little help with walking and/or transfers;Supervision due to cognitive status;A little help with bathing/dressing/bathroom   Can travel by private vehicle     No  Equipment Recommendations  Other (comment)    Recommendations for Other Services       Precautions / Restrictions Precautions Precautions: Fall Recall of Precautions/Restrictions: Impaired Restrictions Weight Bearing Restrictions Per Provider Order: No LLE Weight Bearing Per Provider Order: Weight bearing as tolerated     Mobility  Bed Mobility Overal bed mobility: Needs Assistance Bed Mobility: Supine to Sit     Supine to sit: Min assist, +2 for safety/equipment, Mod assist, +2 for physical assistance     General bed mobility comments: assist to  progress LEs off bed and elevate trunk with hand hold to guide to upright    Transfers Overall transfer level: Needs assistance Equipment used: Rolling walker (2 wheels) Transfers: Sit to/from Stand Sit to Stand: Min assist, +2 physical assistance, +2 safety/equipment, From  elevated surface           General transfer comment: cues for hand placement and safety; assist to balance in standing    Ambulation/Gait Ambulation/Gait assistance: Min assist, +2 physical assistance, +2 safety/equipment Gait Distance (Feet): 10 Feet Assistive device: Rolling walker (2 wheels) Gait Pattern/deviations: Step-to pattern, Decreased weight shift to left       General Gait Details: multi-modal cues for RW position, self assist, sequence and to place wt on LLE. pt was able to keep L toes in contact with floor and lightly wt shift to LLE, intermittent assist to advance LLE, support to balance when advancing RLE   Stairs             Wheelchair Mobility     Tilt Bed    Modified Rankin (Stroke Patients Only)       Balance Overall balance assessment: Needs assistance, History of Falls Sitting-balance support: Feet supported, Single extremity supported Sitting balance-Leahy Scale: Fair Sitting balance - Comments: with incr time in sitting pt able to maintain static stand   Standing balance support: During functional activity, Reliant on assistive device for balance Standing balance-Leahy Scale: Poor Standing balance comment: reliant on device and external assist                            Communication Communication Communication: Impaired Factors Affecting Communication: Difficulty expressing self;Reduced clarity of speech  Cognition Arousal: Alert Behavior During Therapy: Flat affect, Restless   PT - Cognitive impairments: History of cognitive impairments  PT - Cognition Comments: follows one step commands        Cueing Cueing Techniques: Verbal cues, Tactile cues, Visual cues, Gestural cues  Exercises      General Comments        Pertinent Vitals/Pain Pain Assessment Pain Assessment: Faces Faces Pain Scale: Hurts little more Pain Location: L hip with movement (pt unable to state  -grimaces) Pain Descriptors / Indicators: Grimacing, Guarding Pain Intervention(s): Limited activity within patient's tolerance, Monitored during session, Repositioned    Home Living                          Prior Function            PT Goals (current goals can now be found in the care plan section) Acute Rehab PT Goals PT Goal Formulation: Patient unable to participate in goal setting Time For Goal Achievement: 12/17/23 Potential to Achieve Goals: Good Progress towards PT goals: Progressing toward goals    Frequency    Min 1X/week      PT Plan      Co-evaluation              AM-PAC PT "6 Clicks" Mobility   Outcome Measure  Help needed turning from your back to your side while in a flat bed without using bedrails?: A Lot Help needed moving from lying on your back to sitting on the side of a flat bed without using bedrails?: A Lot Help needed moving to and from a bed to a chair (including a wheelchair)?: A Lot Help needed standing up from a chair using your arms (e.g., wheelchair or bedside chair)?: A Lot Help needed to walk in hospital room?: Total Help needed climbing 3-5 steps with a railing? : Total 6 Click Score: 10    End of Session Equipment Utilized During Treatment: Gait belt Activity Tolerance: Patient tolerated treatment well;Patient limited by fatigue Patient left: in chair;with call bell/phone within reach;with chair alarm set Nurse Communication: Mobility status PT Visit Diagnosis: Other abnormalities of gait and mobility (R26.89)     Time: 2130-8657 PT Time Calculation (min) (ACUTE ONLY): 9 min  Charges:    $Gait Training: 8-22 mins PT General Charges $$ ACUTE PT VISIT: 1 Visit                     Delice Bison, PT  Acute Rehab Dept [facility]) [phone]  12/05/2023    [facility] 12/05/2023, 2:34 PM

## 2023-12-06 DIAGNOSIS — S72002A Fracture of unspecified part of neck of left femur, initial encounter for closed fracture: Secondary | ICD-10-CM | POA: Diagnosis not present

## 2023-12-06 MED ORDER — SENNA 8.6 MG PO TABS: 1.0000 | ORAL_TABLET | Freq: Two times a day (BID) | ORAL | Status: AC

## 2023-12-06 MED ORDER — FERROUS SULFATE 325 (65 FE) MG PO TABS: 325.0000 mg | ORAL_TABLET | Freq: Three times a day (TID) | ORAL | Status: AC

## 2023-12-06 MED ORDER — DOCUSATE SODIUM 100 MG PO CAPS: 100.0000 mg | ORAL_CAPSULE | Freq: Two times a day (BID) | ORAL | Status: AC

## 2023-12-06 MED ORDER — METOPROLOL SUCCINATE ER 25 MG PO TB24: 25.0000 mg | ORAL_TABLET | Freq: Two times a day (BID) | ORAL | Status: AC

## 2023-12-06 NOTE — Plan of Care (Signed)
Patient discharging via PT[facility] to [facility] facility. Report called to [place], facility staff. AVS placed in packet for PTAR/facility. Haydee Salter, RN 12/06/23 11:17 AM

## 2023-12-06 NOTE — Plan of Care (Signed)
  Problem: Health Behavior/Discharge Planning: Goal: Ability to manage health-related needs will improve Outcome: Progressing   Problem: Clinical Measurements: Goal: Ability to maintain clinical measurements within normal limits will improve Outcome: Progressing Goal: Will remain free from infection Outcome: Progressing Goal: Diagnostic test results will improve Outcome: Progressing   Problem: Activity: Goal: Risk for activity intolerance will decrease Outcome: Adequate for Discharge   Problem: Safety: Goal: Ability to remain free from injury will improve Outcome: Progressing   Problem: Activity: Goal: Ability to ambulate and perform ADLs will improve Outcome: Adequate for Discharge

## 2023-12-06 NOTE — TOC Transition Note (Signed)
Transition of Care [facility]) - Discharge Note   Patient Details  Name: Donald Montoya MRN: [id number] Date of Birth: 10-18-1961  Transition of Care [facility]) CM/SW Contact:  Howell Rucks, RN Phone Number: 12/06/2023, 10:23 AM   Clinical Narrative:   DC to [facility], confirmed bed available for transfer today, RM 603, call report [phone], pt's sister, Claris Che notified. PTAR for transport. No further TOC needs identified.    Final next level of care: Skilled Nursing Facility Barriers to Discharge: Barriers Resolved   Patient Goals and CMS Choice Patient states their goals for this hospitalization and ongoing recovery are:: return to [facility] CMS Medicare.gov Compare Post Acute Care list provided to:: Patient Represenative (must comment) Eugenie Norrie (Sister)  [phone] [facility] Phone)) Choice offered to / list presented to : Sibling [place] ownership interest in [facility].provided to:: Sibling    Discharge Placement              Patient chooses bed at: [facility] Patient to be transferred to facility by: PTAR Name of family member notified: Eugenie Norrie (Sister)  [phone] [facility] Phone) Patient and family notified of of transfer: 12/06/23  Discharge Plan and Services Additional resources added to the After Visit Summary for                                       Social Drivers of Health (SDOH) Interventions SDOH Screenings   Food Insecurity: Patient Unable To Answer (12/01/2023)  Housing: Patient Unable To Answer (12/01/2023)  Transportation Needs: Patient Unable To Answer (12/01/2023)  Utilities: Patient Unable To Answer (12/01/2023)  Social Connections: Unknown (12/01/2023)  Tobacco Use: Low Risk  (12/01/2023)     Readmission Risk Interventions    12/06/2023   10:21 AM 12/06/2023   10:20 AM  Readmission Risk Prevention Plan  Post Dischage Appt Complete Complete  Medication Screening Complete Complete   Transportation Screening Complete Complete

## 2023-12-06 NOTE — TOC Progression Note (Signed)
Transition of Care [facility]) - Progression Note    Patient Details  Name: Donald Montoya MRN: [id number] Date of Birth: 06/19/1961  Transition of Care [facility]) CM/SW Contact  Howell Rucks, RN Phone Number: 12/06/2023, 8:57 AM  Clinical Narrative:   Call to pt's sister, Donald Montoya, to review short term rehab bed offers, accepted bed offer at [facility]. Marcelino Duster, admissions coordinator at [facility], confirmed bed available for transfer today, RM 603, call report [phone], team notified.          Expected Discharge Plan and Services                                               Social Determinants of Health (SDOH) Interventions SDOH Screenings   Food Insecurity: Patient Unable To Answer (12/01/2023)  Housing: Patient Unable To Answer (12/01/2023)  Transportation Needs: Patient Unable To Answer (12/01/2023)  Utilities: Patient Unable To Answer (12/01/2023)  Social Connections: Unknown (12/01/2023)  Tobacco Use: Low Risk  (12/01/2023)    Readmission Risk Interventions     No data to display

## 2023-12-06 NOTE — Discharge Summary (Signed)
Physician Discharge Summary  Donald Montoya ZOX:[id number] DOB: [birth year] DOA: 12/01/2023  PCP: Ron Parker, MD  Admit date: 12/01/2023 Discharge date: 12/06/2023  Admitted From: Home Disposition: [facility]  Recommendations for Outpatient Follow-up:  Follow up with PCP in 1-2 weeks Follow-up with orthopedics, Dr. Dion Saucier 2 weeks Continue Lovenox for DVT prophylaxis per orthopedics  Home Health: No Equipment/Devices: None  Discharge Condition: Stable CODE STATUS: Full code Diet recommendation:   History of present illness:  Donald Montoya is a 63 y.o. male with past medical history significant for dementia, HTN, HLD, hypothyroidism who presented to [facility] ED on 11/30/18/2025 from [facility] memory care unit following mechanical fall.  Patient apparently fell out of wheelchair in which he landed on his left side with immediate pain.  Denies striking his head and no syncope or loss of consciousness.  Not on a blood thinner.  EMS was activated and patient was brought to the ED for further evaluation management.   In the ED, temperature 97.6 F, HR 62, RR 16, BP 142/72, SpO2 95% on room air.  WBC 6.3, hemoglobin 13.1, platelet count 224.  Sodium 143, potassium 4.2, chloride 100, CO2 32, glucose 98, BUN 20, creatinine 0.93.  INR 1.0.  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  CT C-spine without contrast with degenerative changes, no acute bony abnormality.  Chest x-ray with no active cardiopulmonary disease process.  Left hip/pelvis x-ray with left femoral neck fracture.  Orthopedics was consulted with plan for surgical repair of his hip fracture.  TRH consulted for further evaluation management of left femoral neck fracture.  Hospital course:  Displaced left femoral neck fracture Patient presenting from  nursing facility after sustaining mechanical fall out of his wheelchair in which he landed on his left side with immediate pain.  Imaging on admission notable for left femoral neck fracture.  Orthopedics was consulted and patient underwent left hip trochanteric femoral nailing by Dr. Dion Saucier on 12/02/2023.  Weightbearing as tolerated left lower extremity.  Lovenox 40 mg [place] daily for postoperative DVT prophylaxis per orthopedics x 4 weeks.  Continue Tylenol, tramadol as needed for pain control.  Outpatient follow-up with orthopedics, Dr. Dion Saucier 2 weeks for wound check.   Essential hypertension Metoprolol succinate 25 mg p.o. twice daily   Hypothyroidism Levothyroxine 25 mcg p.o. daily   Hyperlipidemia Atorvastatin 10 mg p.o. daily   Iron deficiency anemia Ferrous sulfate 325 mg p.o. 3 times daily   Dementia -- Delirium precautions -- Get up during the day -- Encourage a familiar face to remain present throughout the day -- Keep blinds open and lights on during daylight hours -- Minimize the use of opioids/benzodiazepines -- Depakote 500 mg p.o. nightly -- From [facility] memory care unit; discharging to [facility] for further rehabilitation  Discharge Diagnoses:  Principal Problem:   Left displaced femoral neck fracture (HCC) Active Problems:   Hypertension   Dementia (HCC)   Fall at home, initial encounter   Hypothyroidism    Discharge Instructions  Discharge Instructions     Call MD for:  difficulty breathing, headache or visual disturbances   Complete by: As directed    Call MD for:  extreme fatigue   Complete by: As directed    Call MD for:  persistant dizziness or light-headedness   Complete by: As directed    Call MD  for:  persistant nausea and vomiting   Complete by: As directed    Call MD for:  severe uncontrolled pain   Complete by: As directed    Call MD for:  temperature >100.4   Complete by: As directed    Diet - low sodium heart healthy    Complete by: As directed    Increase activity slowly   Complete by: As directed    Weight bearing as tolerated   Complete by: As directed       Allergies as of 12/06/2023   No Known Allergies      Medication List     STOP taking these medications    HYDROcodone-acetaminophen 5-325 MG tablet Commonly known as: NORCO/VICODIN   metoprolol tartrate 50 MG tablet Commonly known as: LOPRESSOR   oseltamivir 75 MG capsule Commonly known as: TAMIFLU       TAKE these medications    aspirin EC 81 MG tablet Take 81 mg by mouth daily.   atorvastatin 10 MG tablet Commonly known as: LIPITOR Take 10 mg by mouth at bedtime.   Baza Protect Moisture Barrier 12 % Crea Generic drug: Zinc Oxide Apply 1 application  topically See admin instructions. Apply to the buttocks EVERY shift and as needed after every incontinent episode   divalproex 500 MG DR tablet Commonly known as: DEPAKOTE Take 500 mg by mouth at bedtime.   docusate sodium 100 MG capsule Commonly known as: COLACE Take 1 capsule (100 mg total) by mouth 2 (two) times daily.   enoxaparin 40 MG/0.4ML injection Commonly known as: LOVENOX Inject 0.4 mLs (40 mg total) into the skin daily.   ferrous sulfate 325 (65 FE) MG tablet Take 1 tablet (325 mg total) by mouth 3 (three) times daily after meals.   levothyroxine 25 MCG tablet Commonly known as: SYNTHROID Take 25 mcg by mouth daily before breakfast.   metoprolol succinate 25 MG 24 hr tablet Commonly known as: TOPROL-XL Take 1 tablet (25 mg total) by mouth 2 (two) times daily.   NUTRITIONAL DRINK MIX PO Take 1 Bottle by mouth See admin instructions. Mighty shake - Drink 1 Mighty Shake by mouth 2 times a day   senna 8.6 MG Tabs tablet Commonly known as: SENOKOT Take 1 tablet (8.6 mg total) by mouth 2 (two) times daily.   traMADol 50 MG tablet Commonly known as: Ultram Take 1 tablet (50 mg total) by mouth every 6 (six) hours as needed.   triamcinolone cream  0.1 % Commonly known as: KENALOG Apply 1 application  topically 2 (two) times daily as needed (for rashes- face).   Vitamin D3 50 MCG (2000 UT) Tabs Take 2,000 Units by mouth daily.               Discharge Care Instructions  (From admission, onward)           Start     Ordered   12/02/23 0000  Weight bearing as tolerated        12/02/23 1114            Contact information for follow-up providers     Teryl Lucy, MD. Schedule an appointment as soon as possible for a visit in 2 week(s).   Specialty: Orthopedic Surgery Contact information: [address] [place] [address] [phone]         Ron Parker, MD. Schedule an appointment as soon as possible for a visit in 1 week(s).   Specialty: Internal Medicine Contact information:  [address] [place] [address] [phone]              Contact information for after-discharge care     Destination     [facility] Preferred SNF .   Service: Skilled Nursing Contact information: [address] [place] [address] [place] [address] [phone]                    No Known Allergies  Consultations: Orthopedics, Dr. Dion Saucier   Procedures/Studies: VAS US CAROTID Result Date: 12/03/2023 Carotid Arterial Duplex Study Patient Name:  SHERMAR FRIEDLAND  Date of Exam:   12/03/2023 Medical Rec #: [id number]           Accession #:    [id number] Date of Birth: Nov 17, 1960           Patient Gender: M Patient Age:   46 years Exam [facility]:  [facility] Procedure:      VAS US CAROTID Referring Phys: Shonna Chock --------------------------------------------------------------------------------  Indications:       History of CVA (cerebrovascular accident) [[id number]]. Risk Factors:      Hypertension. Limitations        Today's exam was limited due to the patient's inability or                    unwillingness to cooperate, the patient's  respiratory                    variation and constant patient movement. Comparison Study:  No prior studies. Performing Technologist: Chanda Busing RVT  Examination Guidelines: A complete evaluation includes B-mode imaging, spectral Doppler, color Doppler, and power Doppler as needed of all accessible portions of each vessel. Bilateral testing is considered an integral part of a complete examination. Limited examinations for reoccurring indications may be performed as noted.  Right Carotid Findings: +----------+--------+--------+--------+-----------------------+--------+           PSV cm/sEDV cm/sStenosisPlaque Description     Comments +----------+--------+--------+--------+-----------------------+--------+ CCA Prox  103     26              smooth and heterogenoustortuous +----------+--------+--------+--------+-----------------------+--------+ CCA Distal78      18              smooth and heterogenous         +----------+--------+--------+--------+-----------------------+--------+ ICA Prox  71      26              smooth and heterogenous         +----------+--------+--------+--------+-----------------------+--------+ ICA Mid   83      35                                     tortuous +----------+--------+--------+--------+-----------------------+--------+ ICA Distal86      40                                     tortuous +----------+--------+--------+--------+-----------------------+--------+ ECA       72      14                                              +----------+--------+--------+--------+-----------------------+--------+ +----------+--------+-------+--------+-------------------+  PSV cm/sEDV cmsDescribeArm Pressure (mmHG) +----------+--------+-------+--------+-------------------+ [id number]                                         +----------+--------+-------+--------+-------------------+ +---------+--------+--+--------+--+---------+  VertebralPSV cm/s49EDV cm/s15Antegrade +---------+--------+--+--------+--+---------+  Left Carotid Findings: +----------+--------+--------+--------+-----------------------+--------+           PSV cm/sEDV cm/sStenosisPlaque Description     Comments +----------+--------+--------+--------+-----------------------+--------+ CCA Prox  131     28              smooth and heterogenous         +----------+--------+--------+--------+-----------------------+--------+ CCA Distal103     33              smooth and heterogenous         +----------+--------+--------+--------+-----------------------+--------+ ICA Prox  47      18              smooth and heterogenous         +----------+--------+--------+--------+-----------------------+--------+ ICA Mid   97      35                                              +----------+--------+--------+--------+-----------------------+--------+ ICA Distal68      21                                     tortuous +----------+--------+--------+--------+-----------------------+--------+ ECA       81      15                                              +----------+--------+--------+--------+-----------------------+--------+ +----------+--------+--------+--------+-------------------+           PSV cm/sEDV cm/sDescribeArm Pressure (mmHG) +----------+--------+--------+--------+-------------------+ [id number]                                          +----------+--------+--------+--------+-------------------+ +---------+--------+--+--------+--+---------+ VertebralPSV cm/s35EDV cm/s14Antegrade +---------+--------+--+--------+--+---------+   Summary: Right Carotid: Velocities in the right ICA are consistent with a 1-39% stenosis. Left Carotid: Velocities in the left ICA are consistent with a 1-39% stenosis. Vertebrals: Bilateral vertebral arteries demonstrate antegrade flow. *See table(s) above for measurements and observations.   Electronically signed by Carolynn Sayers on 12/03/2023 at 5:00:12 PM.    Final    DG HIP UNILAT W OR W/O PELVIS 2-3 VIEWS LEFT Result Date: 12/02/2023 CLINICAL DATA:  Left hip ORIF. EXAM: DG HIP (WITH OR WITHOUT PELVIS) 2-3V LEFT COMPARISON:  Intraoperative x-rays from same day. Left hip x-rays from yesterday. FINDINGS: Interval gamma nail fixation of the left basicervical femoral neck fracture, now in anatomic alignment. Hardware is intact. Expected postsurgical changes and subcutaneous emphysema in the surrounding soft tissues. IMPRESSION: 1. Interval ORIF of the left basicervical femoral neck fracture. Electronically Signed   By: Obie Dredge M.D.   On: 12/02/2023 15:05   DG FEMUR 1V LEFT Result Date: 12/02/2023 CLINICAL DATA:  [id number] Surgery, elective [id number]; intraop; surgery, ORIF intertrochanteric left femur fracture EXAM: LEFT FEMUR 1 VIEW; DG C-ARM 1-60 MIN-NO REPORT COMPARISON:  12/01/2023 left femur radiographs FLUOROSCOPY TIME:  Radiation Exposure Index (if provided by the fluoroscopic device): 10.2 mGy FINDINGS: Multiple spot fluoroscopic nondiagnostic intraoperative left femur radiographs demonstrate transfixation of intertrochanteric proximal left femur fracture in near-anatomic alignment by intramedullary rod with interlocking left femoral neck pin and distal interlocking screw. IMPRESSION: Intraoperative fluoroscopic guidance for ORIF of intertrochanteric proximal left femur fracture. Electronically Signed   By: Delbert Phenix M.D.   On: 12/02/2023 11:12   DG C-Arm 1-60 Min-No Report Result Date: 12/02/2023 Fluoroscopy was utilized by the requesting physician.  No radiographic interpretation.   DG C-Arm 1-60 Min-No Report Result Date: 12/02/2023 Fluoroscopy was utilized by the requesting physician.  No radiographic interpretation.   CT Hip Left Wo Contrast Result Date: 12/01/2023 CLINICAL DATA:  Left femoral neck fracture. EXAM: CT OF THE LEFT HIP WITHOUT CONTRAST TECHNIQUE: Multidetector CT  imaging of the left hip was performed according to the standard protocol. Multiplanar CT image reconstructions were also generated. RADIATION DOSE REDUCTION: This exam was performed according to the departmental dose-optimization program which includes automated exposure control, adjustment of the mA and/or kV according to patient size and/or use of iterative reconstruction technique. COMPARISON:  Earlier radiograph dated 12/01/2023. FINDINGS: Bones/Joint/Cartilage There is a comminuted and mildly impacted fracture of the left femoral neck involving the basicervical/intertrochanteric region. There is mild valgus angulation. No dislocation. The bones are osteopenic. No significant effusion. Ligaments Suboptimally assessed by CT. Muscles and Tendons No intramuscular fluid collection or hematoma. Soft tissues No acute findings. IMPRESSION: Comminuted and mildly impacted fracture of the left femoral neck. Electronically Signed   By: Elgie Collard M.D.   On: 12/01/2023 21:01   DG FEMUR MIN 2 VIEWS LEFT Result Date: 12/01/2023 CLINICAL DATA:  Left femoral neck fracture. EXAM: LEFT FEMUR 2 VIEWS COMPARISON:  Left hip radiograph dated 12/01/2023. FINDINGS: Angulated fracture of the left femoral neck. No other acute fracture. The bones are osteopenic. No dislocation. The soft tissues are unremarkable. IMPRESSION: Angulated fracture of the left femoral neck. Electronically Signed   By: Elgie Collard M.D.   On: 12/01/2023 20:32   DG HIP UNILAT WITH PELVIS 2-3 VIEWS LEFT Result Date: 12/01/2023 CLINICAL DATA:  Fall and left hip pain. EXAM: DG HIP (WITH OR WITHOUT PELVIS) 2-3V LEFT COMPARISON:  Left knee radiograph dated 03/12/2016. FINDINGS: There is an intact fracture of the left femoral neck with valgus angulation. The bones are osteopenic. No dislocation. Small calcific densities of the soft tissues of the left hip. IMPRESSION: Left femoral neck fracture. Electronically Signed   By: Elgie Collard M.D.   On:  12/01/2023 20:08   DG Chest Port 1 View Result Date: 12/01/2023 CLINICAL DATA:  Fall and left hip pain. EXAM: PORTABLE CHEST 1 VIEW COMPARISON:  None Available. FINDINGS: Evaluation is limited due to patient's positioning. No focal consolidation, pleural effusion, or pneumothorax. Stable cardiac silhouette. Osteopenia with degenerative changes of the spine. No acute osseous pathology. IMPRESSION: No active disease. Electronically Signed   By: Elgie Collard M.D.   On: 12/01/2023 20:07   CT Cervical Spine Wo Contrast Result Date: 12/01/2023 CLINICAL DATA:  Polytrauma, blunt.  Fall. EXAM: CT CERVICAL SPINE WITHOUT CONTRAST TECHNIQUE: Multidetector CT imaging of the cervical spine was performed without intravenous contrast. Multiplanar CT image reconstructions were also generated. RADIATION DOSE REDUCTION: This exam was performed according to the departmental dose-optimization program which includes automated exposure control, adjustment of the mA and/or kV according to patient size and/or use of iterative reconstruction technique. COMPARISON:  11/16/2017 FINDINGS: Alignment: Normal Skull base and vertebrae: No acute fracture. No primary bone lesion or focal pathologic process. Soft tissues and spinal canal: No prevertebral fluid or swelling. No visible canal hematoma. Disc levels: Maintained. Anterior spurring in the lower cervical spine. Moderate bilateral degenerative facet disease, right greater than left. Upper chest: No acute findings Other: None IMPRESSION: Degenerative changes.  No acute bony abnormality. Electronically Signed   By: Charlett Nose M.D.   On: 12/01/2023 19:31   CT Head Wo Contrast Result Date: 12/01/2023 CLINICAL DATA:  Head trauma, moderate-severe.  Fall. EXAM: CT HEAD WITHOUT CONTRAST TECHNIQUE: Contiguous axial images were obtained from the base of the skull through the vertex without intravenous contrast. RADIATION DOSE REDUCTION: This exam was performed according to the departmental  dose-optimization program which includes automated exposure control, adjustment of the mA and/or kV according to patient size and/or use of iterative reconstruction technique. COMPARISON:  11/16/2017 FINDINGS: Brain: Old left cerebellar infarct, stable. Old bilateral basal ganglia lacunar infarcts, stable. There is atrophy and chronic small vessel disease changes. No acute intracranial abnormality. Specifically, no hemorrhage, hydrocephalus, mass lesion, acute infarction, or significant intracranial injury. Vascular: No hyperdense vessel or unexpected calcification. Skull: No acute calvarial abnormality. Sinuses/Orbits: No acute findings Other: None IMPRESSION: Atrophy, chronic microvascular disease. No acute intracranial abnormality. 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 Electronically Signed   By: Charlett Nose M.D.   On: 12/01/2023 19:29     Subjective: Patient seen examined bedside, resting calmly.  Lying in bed.  Pleasantly confused.  RN present at bedside.  In no apparent distress.  Discharging to SNF today.  No acute events overnight per nursing staff.  Discharge Exam: Vitals:   12/05/23 2235 12/06/23 0540  BP: 130/77 (!) 148/75  Pulse: 76 70  Resp: 16 17  Temp: 97.7 F (36.5 C) 97.6 F (36.4 C)  SpO2: 99% 97%   Vitals:   12/05/23 0637 12/05/23 1153 12/05/23 2235 12/06/23 0540  BP: 120/73 120/75 130/77 (!) 148/75  Pulse: 71 75 76 70  Resp: 18 18 16 17   Temp: 98.2 F (36.8 C) 97.7 F (36.5 C) 97.7 F (36.5 C) 97.6 F (36.4 C)  TempSrc: Oral  Oral Oral  SpO2: 96% 97% 99% 97%  Weight:      Height:        Physical Exam: GEN: NAD, alert, pleasantly confused HEENT: NCAT, PERRL, EOMI, sclera clear, MMM, poor dentition PULM: CTAB w/o wheezes/crackles, normal respiratory effort, on room air CV: RRR w/o M/G/R GI: abd soft, NTND, + BS MSK: no peripheral edema, moves all extremities independently, surgical incision site noted with dressing in  place, clean/dry/intact NEURO: No focal neurological deficits Integumentary: Surgical incision site as above, otherwise no concerning rashes/lesions/wounds noted on exposed skin surfaces    The results of significant diagnostics from this hospitalization (including imaging, microbiology, ancillary and laboratory) are listed below for reference.     Microbiology: Recent Results (from the past 240 hours)  Surgical PCR screen     Status: None   Collection Time: 12/02/23  4:29 AM   Specimen: Nasal Mucosa; Nasal Swab  Result Value Ref Range Status   MRSA, PCR NEGATIVE NEGATIVE Final   Staphylococcus aureus NEGATIVE NEGATIVE Final    Comment: (NOTE) The Xpert SA Assay (FDA approved for NASAL specimens in patients 86 years of age and older), is one component of a comprehensive surveillance program. It is not intended to diagnose infection nor to guide or monitor treatment.  Performed at [facility], [address], [place], [place] [address]      Labs: BNP (last 3 results) No results for input(s): "BNP" in the last 8760 hours. Basic Metabolic Panel: Recent Labs  Lab 12/01/23 1957 12/02/23 0333 12/02/23 1310 12/03/23 0302 12/04/23 0338  NA 143 144  --  137 137  K 4.2 3.7  --  3.7 4.1  CL 100 101  --  99 99  CO2 32 31  --  30 31  GLUCOSE 98 133*  --  102* 86  BUN 20 19  --  19 19  CREATININE 0.93 0.78 0.81 0.75 0.61  CALCIUM 9.3 9.1  --  8.1* 8.4*   Liver Function Tests: Recent Labs  Lab 12/02/23 0333  AST 30  ALT 22  ALKPHOS 99  BILITOT 0.7  PROT 6.7  ALBUMIN 3.5   No results for input(s): "LIPASE", "AMYLASE" in the last 168 hours. No results for input(s): "AMMONIA" in the last 168 hours. CBC: Recent Labs  Lab 12/01/23 1957 12/02/23 0333 12/02/23 1310 12/03/23 0302 12/04/23 0338  WBC 6.3 9.1 8.1 9.4 10.0  HGB 13.1 12.6* 10.7* 9.9* 10.1*  HCT 39.6 38.9* 33.1* 29.9* 30.6*  MCV 89.4 89.0 89.2 89.0 88.4  PLT 224 214 173 171 181    Cardiac Enzymes: No results for input(s): "CKTOTAL", "CKMB", "CKMBINDEX", "TROPONINI" in the last 168 hours. BNP: Invalid input(s): "POCBNP" CBG: No results for input(s): "GLUCAP" in the last 168 hours. D-Dimer No results for input(s): "DDIMER" in the last 72 hours. Hgb A1c No results for input(s): "HGBA1C" in the last 72 hours. Lipid Profile Recent Labs    12/04/23 0338  CHOL 94  HDL 39*  LDLCALC 36  TRIG 96  CHOLHDL 2.4   Thyroid function studies Recent Labs    12/03/23 1407  TSH 0.490   Anemia work up Recent Labs    12/03/23 1407  VITAMINB12 375   Urinalysis    Component Value Date/Time   COLORURINE YELLOW 06/18/2017 1808   APPEARANCEUR CLEAR 06/18/2017 1808   LABSPEC 1.024 06/18/2017 1808   PHURINE 5.0 06/18/2017 1808   GLUCOSEU NEGATIVE 06/18/2017 1808   HGBUR NEGATIVE 06/18/2017 1808   BILIRUBINUR NEGATIVE 06/18/2017 1808   KETONESUR NEGATIVE 06/18/2017 1808   PROTEINUR NEGATIVE 06/18/2017 1808   UROBILINOGEN 1.0 08/27/2009 2303   NITRITE NEGATIVE 06/18/2017 1808   LEUKOCYTESUR NEGATIVE 06/18/2017 1808   Sepsis Labs Recent Labs  Lab 12/02/23 0333 12/02/23 1310 12/03/23 0302 12/04/23 0338  WBC 9.1 8.1 9.4 10.0   Microbiology Recent Results (from the past 240 hours)  Surgical PCR screen     Status: None   Collection Time: 12/02/23  4:29 AM   Specimen: Nasal Mucosa; Nasal Swab  Result Value Ref Range Status   MRSA, PCR NEGATIVE NEGATIVE Final   Staphylococcus aureus NEGATIVE NEGATIVE Final    Comment: (NOTE) The Xpert SA Assay (FDA approved for NASAL specimens in patients 36 years of age and older), is one component of a comprehensive surveillance program. It is not intended to diagnose infection nor to guide or monitor treatment. Performed at [facility], [address] [place], [place] [address]      Time coordinating discharge: Over 30 minutes  SIGNED:   Alvira Philips [place], DO  [facility] 12/06/2023,  9:52 AM

## 2024-01-03 ENCOUNTER — Other Ambulatory Visit: Payer: Self-pay

## 2024-01-03 ENCOUNTER — Emergency Department (HOSPITAL_COMMUNITY)

## 2024-01-03 ENCOUNTER — Emergency Department (HOSPITAL_COMMUNITY)
Admission: EM | Admit: 2024-01-03 | Discharge: 2024-01-04 | Disposition: A | Discharge status: Home / Self Care | Attending: Emergency Medicine | Admitting: Emergency Medicine

## 2024-01-03 DIAGNOSIS — E039 Hypothyroidism, unspecified: Secondary | ICD-10-CM | POA: Diagnosis not present

## 2024-01-03 DIAGNOSIS — Z7982 Long term (current) use of aspirin: Secondary | ICD-10-CM | POA: Diagnosis not present

## 2024-01-03 DIAGNOSIS — R4182 Altered mental status, unspecified: Secondary | ICD-10-CM | POA: Diagnosis present

## 2024-01-03 DIAGNOSIS — I10 Essential (primary) hypertension: Secondary | ICD-10-CM | POA: Diagnosis not present

## 2024-01-03 DIAGNOSIS — F0390 Unspecified dementia without behavioral disturbance: Secondary | ICD-10-CM | POA: Insufficient documentation

## 2024-01-03 DIAGNOSIS — Z79899 Other long term (current) drug therapy: Secondary | ICD-10-CM | POA: Insufficient documentation

## 2024-01-03 DIAGNOSIS — Z1152 Encounter for screening for COVID-19: Secondary | ICD-10-CM | POA: Insufficient documentation

## 2024-01-03 DIAGNOSIS — I671 Cerebral aneurysm, nonruptured: Secondary | ICD-10-CM

## 2024-01-03 LAB — ETHANOL: Alcohol, Ethyl (B): <10 mg/dL (ref <10)

## 2024-01-03 LAB — RESP PANEL BY RT-PCR (RSV, FLU A&B, COVID)  RVPGX2
Influenza A by PCR: NEGATIVE
Influenza B by PCR: NEGATIVE
Resp Syncytial Virus by PCR: NEGATIVE
SARS Coronavirus 2 by RT PCR: NEGATIVE

## 2024-01-03 LAB — CBC WITH DIFFERENTIAL/PLATELET
Abs Immature Granulocytes: 0.03 10*3/uL (ref 0.00–0.07)
Basophils Absolute: 0 10*3/uL (ref 0.0–0.1)
Basophils Relative: 0 %
Eosinophils Absolute: 0.1 10*3/uL (ref 0.0–0.5)
Eosinophils Relative: 2 %
HCT: 38.7 % — ABNORMAL LOW (ref 39.0–52.0)
Hemoglobin: 12.7 g/dL — ABNORMAL LOW (ref 13.0–17.0)
Immature Granulocytes: 0 %
Lymphocytes Relative: 9 %
Lymphs Abs: 0.7 10*3/uL (ref 0.7–4.0)
MCH: 29.3 pg (ref 26.0–34.0)
MCHC: 32.8 g/dL (ref 30.0–36.0)
MCV: 89.4 fL (ref 80.0–100.0)
Monocytes Absolute: 0.6 10*3/uL (ref 0.1–1.0)
Monocytes Relative: 8 %
Neutro Abs: 6.2 10*3/uL (ref 1.7–7.7)
Neutrophils Relative %: 81 %
Platelets: 186 10*3/uL (ref 150–400)
RBC: 4.33 MIL/uL (ref 4.22–5.81)
RDW: 13.7 % (ref 11.5–15.5)
WBC: 7.6 10*3/uL (ref 4.0–10.5)
nRBC: 0 % (ref 0.0–0.2)

## 2024-01-03 LAB — COMPREHENSIVE METABOLIC PANEL
ALT: 12 U/L (ref 0–44)
AST: 16 U/L (ref 15–41)
Albumin: 2.7 g/dL — ABNORMAL LOW (ref 3.5–5.0)
Alkaline Phosphatase: 88 U/L (ref 38–126)
Anion gap: 8 (ref 5–15)
BUN: 21 mg/dL (ref 8–23)
CO2: 28 mmol/L (ref 22–32)
Calcium: 8.3 mg/dL — ABNORMAL LOW (ref 8.9–10.3)
Chloride: 107 mmol/L (ref 98–111)
Creatinine, Ser: 0.85 mg/dL (ref 0.61–1.24)
GFR, Estimated: >60 mL/min (ref >60)
Glucose, Bld: 81 mg/dL (ref 70–99)
Potassium: 3.5 mmol/L (ref 3.5–5.1)
Sodium: 143 mmol/L (ref 135–145)
Total Bilirubin: 0.4 mg/dL (ref 0.0–1.2)
Total Protein: 5.5 g/dL — ABNORMAL LOW (ref 6.5–8.1)

## 2024-01-03 LAB — AMMONIA: Ammonia: 30 umol/L (ref 9–35)

## 2024-01-03 LAB — LACTIC ACID, PLASMA: Lactic Acid, Venous: 1 mmol/L (ref 0.5–1.9)

## 2024-01-03 MED ORDER — IOHEXOL 350 MG/ML SOLN
75.0000 mL | Freq: Once | INTRAVENOUS | Status: AC | PRN
  Administered 2024-01-03: 75 mL via INTRAVENOUS

## 2024-01-03 MED ORDER — SODIUM CHLORIDE 0.9 % IV BOLUS
500.0000 mL | Freq: Once | INTRAVENOUS | Status: AC
  Administered 2024-01-03: 500 mL via INTRAVENOUS

## 2024-01-03 NOTE — ED Triage Notes (Signed)
 EMS from [facility] with c/o AMS  Pt having decreased mentation, staff states pt not at baseline this am, pt normally "loud, and this morning quiet" Staff also stated pt had fall out of wheelchair this am, which was witnessed by staff, and state no head trauma or LOC.  EMS states left arm drift noted, LKN 1930 last night. Pt at GCS 14, which staff stated was baseline. Pupils unequal for EMS, and low grade 99.2 temp PMH:  Dementia Anxiety

## 2024-01-03 NOTE — Discharge Instructions (Signed)
 Call to make a follow-up appointment with Dr. Conchita Paris.  Return to the emergency room if you have any worsening symptoms.

## 2024-01-03 NOTE — ED Notes (Signed)
Got patient into a gown on the monitor did EKG shown to er provider patient is resting with call bell in reach  

## 2024-01-03 NOTE — ED Provider Notes (Addendum)
 Patient care was taken over from Dr. Andria Meuse.  Patient presented with altered mental status and concerns for stroke.  Patient is awake and alert.  He is oriented to person and place but confused to year.  I do not appreciate any focal deficits.  He had an MRI which does not show any evidence of acute infarct.  However there was a concern for cerebral aneurysm.  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  I discussed this with Patrici Ranks, PA with neurosurgery.  They advised that if patient does not have any acute neurodeficits, he can be discharged with outpatient follow-up with Dr. Conchita Paris.  I discussed this with the patient's sister, Eugenie Norrie, by telephone.  She states that she makes the patient's healthcare decisions.  Awaiting u/a.  Pt can likely be discharged back to the nursing facility following this.   Care turned over to Sharilyn Sites, PA pending u/a.  Even if it shows infection, he can be discharged back to the nursing facility on antibiotics.   Rolan Bucco, MD 01/03/24 [id number]    Rolan Bucco, MD 01/03/24 2350    Rolan Bucco, MD 01/04/24 0002

## 2024-01-03 NOTE — ED Notes (Signed)
 This RN spoke to nurse tech at [facility], updates given at this time. Nurse tech requested report be called to [place] at [facility] if pt is to be discharged later this evening.

## 2024-01-03 NOTE — ED Provider Notes (Signed)
 [place] EMERGENCY DEPARTMENT AT [facility] Provider Note  CSN: [id number] Arrival date & time: 01/03/24 1119  Chief Complaint(s) Altered Mental Status  HPI Donald Montoya is a 63 y.o. male who is here today for altered mental status.  Patient has a past medical history of unspecified dementia, lives in a memory care unit.  Nursing home staff reports that the patient was not at his baseline this morning.  At baseline, patient alert, somewhat aggressive.'s morning patient quiet.  Staff stated that the patient had a fall out of his wheelchair this a.m.  It was witnessed.  There is no head trauma or LOC.   Past Medical History Past Medical History:  Diagnosis Date   AKI (acute kidney injury) (HCC) 05/2017   Dementia (HCC)    Hypertension    Thyroid disease    Patient Active Problem List   Diagnosis Date Noted   Fall at home, initial encounter 12/01/2023   Left displaced femoral neck fracture (HCC) 12/01/2023   Hypothyroidism 12/01/2023   Sepsis (HCC) 06/18/2017   AKI (acute kidney injury) (HCC) 06/18/2017   Hypernatremia 06/18/2017   Near syncope 10/13/2015   Bradycardia 10/13/2015   Hypertension 10/13/2015   Dementia (HCC) 10/13/2015   Syncope, cardiogenic 10/13/2015   Home Medication(s) Prior to Admission medications   Medication Sig Start Date End Date Taking? Authorizing Provider  metoprolol tartrate (LOPRESSOR) 50 MG tablet Take 50 mg by mouth 2 (two) times daily. 12/28/23   [provider]  aspirin EC 81 MG tablet Take 81 mg by mouth daily.    [provider]  atorvastatin (LIPITOR) 10 MG tablet Take 10 mg by mouth at bedtime.    [provider]  BAZA PROTECT MOISTURE BARRIER 12 % CREA Apply 1 application  topically See admin instructions. Apply to the buttocks EVERY shift and as needed after every incontinent episode    [provider]  Cholecalciferol (VITAMIN D3) 50 MCG (2000 UT) TABS Take 2,000 Units by mouth daily.     [provider]  divalproex (DEPAKOTE) 500 MG DR tablet Take 500 mg by mouth at bedtime.    [provider]  docusate sodium (COLACE) 100 MG capsule Take 1 capsule (100 mg total) by mouth 2 (two) times daily. 12/06/23   [place], Eric J, DO  enoxaparin (LOVENOX) 40 MG/0.4ML injection Inject 0.4 mLs (40 mg total) into the skin daily. 12/02/23   Teryl Lucy, MD  ferrous sulfate 325 (65 FE) MG tablet Take 1 tablet (325 mg total) by mouth 3 (three) times daily after meals. 12/06/23   [place], Alvira Philips, DO  levothyroxine (SYNTHROID, LEVOTHROID) 25 MCG tablet Take 25 mcg by mouth daily before breakfast.    [provider]  metoprolol succinate (TOPROL-XL) 25 MG 24 hr tablet Take 1 tablet (25 mg total) by mouth 2 (two) times daily. 12/06/23   [place], Alvira Philips, DO  Nutritional Supplements (NUTRITIONAL DRINK MIX PO) Take 1 Bottle by mouth See admin instructions. Mighty shake - Drink 1 Mighty Shake by mouth 2 times a day    [provider]  senna (SENOKOT) 8.6 MG TABS tablet Take 1 tablet (8.6 mg total) by mouth 2 (two) times daily. 12/06/23   [place], Alvira Philips, DO  traMADol (ULTRAM) 50 MG tablet Take 1 tablet (50 mg total) by mouth every 6 (six) hours as needed. 12/02/23 12/01/24  Teryl Lucy, MD  triamcinolone cream (KENALOG) 0.1 % Apply 1 application  topically 2 (two) times daily as needed (  for rashes- face).    [provider]                                                                                                                                    Past Surgical History Past Surgical History:  Procedure Laterality Date   INTRAMEDULLARY (IM) NAIL INTERTROCHANTERIC Left 12/02/2023   Procedure: INTRAMEDULLARY (IM) NAIL INTERTROCHANTERIC;  Surgeon: Teryl Lucy, MD;  Location: [facility] ORS;  Service: Orthopedics;  Laterality: Left;   Family History Family History  Family history unknown: Yes    Social History Social History   Tobacco Use   Smoking status:  Never   Smokeless tobacco: Never  Vaping Use   Vaping status: Never Used  Substance Use Topics   Alcohol use: No   Drug use: No   Allergies Patient has no known allergies.  Review of Systems Review of Systems  Physical Exam Vital Signs  I have reviewed the triage vital signs BP 109/71 (BP Location: Right Arm)   Pulse 61   Temp 98.2 F (36.8 C) (Oral)   Resp 18   SpO2 98%   Physical Exam Vitals reviewed.  Constitutional:      Appearance: He is not toxic-appearing.  HENT:     Head: Normocephalic.     Mouth/Throat:     Mouth: Mucous membranes are dry.  Pulmonary:     Effort: Pulmonary effort is normal.  Abdominal:     General: Abdomen is flat.     Palpations: Abdomen is soft.  Musculoskeletal:     Cervical back: Normal range of motion.  Neurological:     Mental Status: He is alert.     Cranial Nerves: No cranial nerve deficit.     Comments: No gaze deficit.  Patient with 5 out of 5 grip strength bilaterally.  Patient able to lift both legs in the bed.     ED Results and Treatments Labs (all labs ordered are listed, but only abnormal results are displayed) Labs Reviewed  RESP PANEL BY RT-PCR (RSV, FLU A&B, COVID)  RVPGX2  CBC WITH DIFFERENTIAL/PLATELET  COMPREHENSIVE METABOLIC PANEL  URINALYSIS, ROUTINE W REFLEX MICROSCOPIC  AMMONIA  LACTIC ACID, PLASMA  RAPID URINE DRUG SCREEN, HOSP PERFORMED  ETHANOL  CBG MONITORING, ED  Radiology No results found.  Pertinent labs & imaging results that were available during my care of the patient were reviewed by me and considered in my medical decision making (see MDM for details).  Medications Ordered in ED Medications - No data to display                                                                                                                                    Procedures Procedures  (including critical care time)  Medical Decision Making / ED Course   This patient presents to the ED for concern of altered mental status, this involves an extensive number of treatment options, and is a complaint that carries with it a high risk of complications and morbidity.  The differential diagnosis includes CVA, electrolyte abnormalities, less likely infection, delirium, dementia.  MDM: On exam, patient does not appear to be in any acute distress.  I am not able to appreciate any lateralizing deficits, however with the patient's baseline history of dementia this is a bit more difficult.  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  I do not observe any traumatic injuries on my exam.  Patient's vital signs are normal.  Will obtain imaging of the patient's head, blood work ordered.  This patient is afebrile, normotensive, nontachycardic.  Reassessment 2:45 PM-my independent review the patient's CT imaging shows no intracranial hemorrhage.  I have ordered an MRI brain on the patient.  Reviewing the patient's blood work, hemoglobin baseline, normal renal function.  Patient be signed out to Dr. Fredderick Phenix pending MRI and disposition.   Additional history obtained: -Additional history obtained from EMS -External records from outside source obtained and reviewed including: Chart review including previous notes, labs, imaging, consultation notes   Lab Tests: -I ordered, reviewed, and interpreted labs.   The pertinent results include:   Labs Reviewed  RESP PANEL BY RT-PCR (RSV, FLU A&B, COVID)  RVPGX2  CBC WITH DIFFERENTIAL/PLATELET  COMPREHENSIVE METABOLIC PANEL  URINALYSIS, ROUTINE W REFLEX MICROSCOPIC  AMMONIA  LACTIC ACID, PLASMA  RAPID URINE DRUG SCREEN, HOSP PERFORMED  ETHANOL  CBG MONITORING, ED      EKG my independent review of the patient's EKG shows no ST segment depressions or elevations, no T wave inversions, no evidence of  acute ischemia.  EKG Interpretation Date/Time:    Ventricular Rate:    PR Interval:    QRS Duration:    QT Interval:    QTC Calculation:   R Axis:      Text Interpretation:           Imaging Studies ordered: I ordered imaging studies including CT imaging of the head I independently visualized and interpreted imaging. I agree with the radiologist interpretation   Medicines ordered and prescription drug management: No orders of the defined types were placed in this encounter.   -I have reviewed the patients home medicines and have made  adjustments as needed   Cardiac Monitoring: The patient was maintained on a cardiac monitor.  I personally viewed and interpreted the cardiac monitored which showed an underlying rhythm of: Normal sinus rhythm  Social Determinants of Health:  Factors impacting patients care include: Multiple medical comorbidities including dementia   Reevaluation: After the interventions noted above, I reevaluated the patient and found that they have :improved  Co morbidities that complicate the patient evaluation  Past Medical History:  Diagnosis Date   AKI (acute kidney injury) (HCC) 05/2017   Dementia (HCC)    Hypertension    Thyroid disease       Dispostion: Signed out to Dr. Fredderick Phenix     Final Clinical Impression(s) / ED Diagnoses Final diagnoses:  None     @PCDICTATION @    Anders Simmonds T, DO 01/03/24 1450

## 2024-01-04 DIAGNOSIS — R4182 Altered mental status, unspecified: Secondary | ICD-10-CM | POA: Diagnosis not present

## 2024-01-04 LAB — URINALYSIS, ROUTINE W REFLEX MICROSCOPIC
Bacteria, UA: NONE SEEN
Bilirubin Urine: NEGATIVE
Glucose, UA: NEGATIVE mg/dL
Hgb urine dipstick: NEGATIVE
Ketones, ur: 20 mg/dL — AB
Nitrite: POSITIVE — AB
Protein, ur: NEGATIVE mg/dL
Specific Gravity, Urine: >1.046 — ABNORMAL HIGH (ref 1.005–1.030)
pH: 5 (ref 5.0–8.0)

## 2024-01-04 LAB — RAPID URINE DRUG SCREEN, HOSP PERFORMED
Amphetamines: NOT DETECTED
Barbiturates: NOT DETECTED
Benzodiazepines: NOT DETECTED
Cocaine: NOT DETECTED
Opiates: NOT DETECTED
Tetrahydrocannabinol: NOT DETECTED

## 2024-01-04 MED ORDER — CEPHALEXIN 500 MG PO CAPS: 500.0000 mg | ORAL_CAPSULE | Freq: Two times a day (BID) | ORAL | 0 refills | Status: AC

## 2024-01-04 NOTE — ED Notes (Signed)
PTAR CALLED  °

## 2024-01-04 NOTE — ED Provider Notes (Signed)
 Results for orders placed or performed during the hospital encounter of 01/03/24  Resp panel by RT-PCR (RSV, Flu A&B, Covid) Anterior Nasal Swab   Collection Time: 01/03/24 11:30 AM   Specimen: Anterior Nasal Swab  Result Value Ref Range   SARS Coronavirus 2 by RT PCR NEGATIVE NEGATIVE   Influenza A by PCR NEGATIVE NEGATIVE   Influenza B by PCR NEGATIVE NEGATIVE   Resp Syncytial Virus by PCR NEGATIVE NEGATIVE  CBC with Differential   Collection Time: 01/03/24 12:00 PM  Result Value Ref Range   WBC 7.6 4.0 - 10.5 K/uL   RBC 4.33 4.22 - 5.81 MIL/uL   Hemoglobin 12.7 (L) 13.0 - 17.0 g/dL   HCT 16.1 (L) 09.6 - 04.5 %   MCV 89.4 80.0 - 100.0 fL   MCH 29.3 26.0 - 34.0 pg   MCHC 32.8 30.0 - 36.0 g/dL   RDW 40.9 81.1 - 91.4 %   Platelets 186 150 - 400 K/uL   nRBC 0.0 0.0 - 0.2 %   Neutrophils Relative % 81 %   Neutro Abs 6.2 1.7 - 7.7 K/uL   Lymphocytes Relative 9 %   Lymphs Abs 0.7 0.7 - 4.0 K/uL   Monocytes Relative 8 %   Monocytes Absolute 0.6 0.1 - 1.0 K/uL   Eosinophils Relative 2 %   Eosinophils Absolute 0.1 0.0 - 0.5 K/uL   Basophils Relative 0 %   Basophils Absolute 0.0 0.0 - 0.1 K/uL   Immature Granulocytes 0 %   Abs Immature Granulocytes 0.03 0.00 - 0.07 K/uL  Comprehensive metabolic panel   Collection Time: 01/03/24 12:00 PM  Result Value Ref Range   Sodium 143 135 - 145 mmol/L   Potassium 3.5 3.5 - 5.1 mmol/L   Chloride 107 98 - 111 mmol/L   CO2 28 22 - 32 mmol/L   Glucose, Bld 81 70 - 99 mg/dL   BUN 21 8 - 23 mg/dL   Creatinine, Ser 7.82 0.61 - 1.24 mg/dL   Calcium 8.3 (L) 8.9 - 10.3 mg/dL   Total Protein 5.5 (L) 6.5 - 8.1 g/dL   Albumin 2.7 (L) 3.5 - 5.0 g/dL   AST 16 15 - 41 U/L   ALT 12 0 - 44 U/L   Alkaline Phosphatase 88 38 - 126 U/L   Total Bilirubin 0.4 0.0 - 1.2 mg/dL   GFR, Estimated >95 >62 mL/min   Anion gap 8 5 - 15  Ammonia   Collection Time: 01/03/24 12:00 PM  Result Value Ref Range   Ammonia 30 9 - 35 umol/L  Lactic acid, plasma    Collection Time: 01/03/24 12:00 PM  Result Value Ref Range   Lactic Acid, Venous 1.0 0.5 - 1.9 mmol/L  Ethanol   Collection Time: 01/03/24 12:00 PM  Result Value Ref Range   Alcohol, Ethyl (B) <10 <10 mg/dL  Urinalysis, Routine w reflex microscopic -Urine, Clean Catch   Collection Time: 01/04/24  1:22 AM  Result Value Ref Range   Color, Urine YELLOW YELLOW   APPearance CLEAR CLEAR   Specific Gravity, Urine >1.046 (H) 1.005 - 1.030   pH 5.0 5.0 - 8.0   Glucose, UA NEGATIVE NEGATIVE mg/dL   Hgb urine dipstick NEGATIVE NEGATIVE   Bilirubin Urine NEGATIVE NEGATIVE   Ketones, ur 20 (A) NEGATIVE mg/dL   Protein, ur NEGATIVE NEGATIVE mg/dL   Nitrite POSITIVE (A) NEGATIVE   Leukocytes,Ua MODERATE (A) NEGATIVE   RBC / HPF 0-5 0 - 5 RBC/hpf   WBC, UA  21-50 0 - 5 WBC/hpf   Bacteria, UA NONE SEEN NONE SEEN   Squamous Epithelial / HPF 0-5 0 - 5 /HPF   Mucus PRESENT   Rapid urine drug screen (hospital performed)   Collection Time: 01/04/24  1:22 AM  Result Value Ref Range   Opiates NONE DETECTED NONE DETECTED   Cocaine NONE DETECTED NONE DETECTED   Benzodiazepines NONE DETECTED NONE DETECTED   Amphetamines NONE DETECTED NONE DETECTED   Tetrahydrocannabinol NONE DETECTED NONE DETECTED   Barbiturates NONE DETECTED NONE DETECTED   CT Angio Head W or Wo Contrast Result Date: 01/03/2024 CLINICAL DATA:  Vascular malformation EXAM: CT ANGIOGRAPHY HEAD TECHNIQUE: Multidetector CT imaging of the head was performed using the standard protocol during bolus administration of intravenous contrast. Multiplanar CT image reconstructions and MIPs were obtained to evaluate the vascular anatomy. RADIATION DOSE REDUCTION: This exam was performed according to the departmental dose-optimization program which includes automated exposure control, adjustment of the mA and/or kV according to patient size and/or use of iterative reconstruction technique. CONTRAST:  75mL OMNIPAQUE IOHEXOL 350 MG/ML SOLN COMPARISON:  None  Available. FINDINGS: POSTERIOR CIRCULATION: There is atherosclerotic calcification of the right vertebral artery with severe stenosis. There is a thrombosed area of fusiform dilatation of the distal right vertebral artery measuring 8 mm in diameter. There is moderate atherosclerosis of the distal left vertebral artery, which remains patent with only mild stenosis. The posteroinferior cerebellar arteries are normal. Basilar artery is normal. Superior cerebellar arteries are normal. Posterior cerebral arteries are normal. ANTERIOR CIRCULATION: Atherosclerotic calcification of the internal carotid arteries at the skull base without hemodynamically significant stenosis. Anterior cerebral arteries are normal. Middle cerebral arteries are normal. Anatomic Variants: None Review of the MIP images confirms the above findings. IMPRESSION: 1. 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 2. 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 3. Moderate atherosclerosis of the distal left vertebral artery, which remains patent with only mild stenosis. Electronically Signed   By: Deatra Robinson M.D.   On: 01/03/2024 21:42   MR BRAIN WO CONTRAST Result Date: 01/03/2024 CLINICAL DATA:  Stroke, altered mental status. EXAM: MRI HEAD WITHOUT CONTRAST TECHNIQUE: Multiplanar, multiecho pulse sequences of the brain and surrounding structures were obtained without intravenous contrast. COMPARISON:  Same day CT head. FINDINGS: Brain: No acute infarct. No evidence of intracranial hemorrhage. FLAIR signal abnormality in the periventricular and subcortical white matter suggestive of mild chronic microvascular ischemic changes. Remote lacunar infarcts in the bilateral basal ganglia and thalami. Additional small remote infarcts in the left mid brain and within the midline ventral pons and left dorsal pons at the level of the superior cerebellar peduncles. Additional remote infarcts in the inferior left cerebellum  with surrounding gliosis. Small remote infarcts in the right cerebellum. No mass lesion or midline shift. The basilar cisterns are patent. No extra-axial fluid collections. Ventricles: Prominence of the lateral ventricles suggestive of underlying parenchymal volume loss. Vascular: Skull base flow voids are visualized. Prominence of the basilar artery flow void measuring up to 8 mm concerning for aneurysm. Skull and upper cervical spine: No focal abnormality. Sinuses/Orbits: Orbits are symmetric. Paranasal sinuses are clear. Other: Small left mastoid effusion. IMPRESSION: 1. No acute intracranial abnormality. 2. Multifocal remote infarcts as above. 3. Similar appearance of advanced cerebral atrophy. Mild chronic microvascular ischemic changes. 4. Prominence of the basilar artery flow void measuring up to 8 mm concerning for aneurysm. Recommend CTA for further evaluation. 5. Small left mastoid effusion. Electronically  Signed   By: Emily Filbert M.D.   On: 01/03/2024 20:27   CT Head Wo Contrast Result Date: 01/03/2024 CLINICAL DATA:  Altered mental status. EXAM: CT HEAD WITHOUT CONTRAST TECHNIQUE: Contiguous axial images were obtained from the base of the skull through the vertex without intravenous contrast. RADIATION DOSE REDUCTION: This exam was performed according to the departmental dose-optimization program which includes automated exposure control, adjustment of the mA and/or kV according to patient size and/or use of iterative reconstruction technique. COMPARISON:  Head CT 12/01/2023 FINDINGS: Brain: There is no evidence of an acute infarct, intracranial hemorrhage, mass, midline shift, or extra-axial fluid collection. A chronic right frontal cortical infarct and chronic small right and moderate-sized left cerebellar infarcts are unchanged, as are chronic infarcts in the left thalamus and bilateral basal ganglia. Advanced cerebral atrophy is unchanged with ex vacuo dilatation of the frontal horns of the  lateral ventricles and of the third ventricle again noted. Mild hypodensities elsewhere in the cerebral white matter are unchanged and nonspecific but compatible with chronic small vessel ischemic disease. Vascular: Calcified atherosclerosis at the skull base. No hyperdense vessel. Skull: No acute fracture or suspicious lesion. Sinuses/Orbits: Visualized paranasal sinuses are clear. Small left mastoid effusion. Unremarkable orbits. Other: None. IMPRESSION: 1. No evidence of acute intracranial abnormality. 2. Unchanged advanced cerebral atrophy and chronic ischemia with multiple old infarcts as detailed above. Electronically Signed   By: Sebastian Ache M.D.   On: 01/03/2024 15:43    UA nitrite + with moderate leuks.  Will send for culture.  No recent urine cultures for review, will start course of keflex.  Plan to discharge back to facility.  Close PCP follow-up.  Return here for new concerns.   Garlon Hatchet, PA-C 01/04/24 0401    Zadie Rhine, MD 01/04/24 [phone]

## 2024-01-06 LAB — URINE CULTURE: Culture: >=100000 — AB

## 2024-02-04 NOTE — Progress Notes (Signed)
 Donald Montoya
